# Patient Record
Sex: Female | Born: 1965 | Race: White | Hispanic: No | Marital: Married | Smoking: Never smoker
Health system: Southern US, Community
[De-identification: ages and names within clinical notes are randomized; demographics above are authoritative.]

## PROBLEM LIST (undated history)

## (undated) DIAGNOSIS — T4145XA Adverse effect of unspecified anesthetic, initial encounter: Secondary | ICD-10-CM

## (undated) DIAGNOSIS — R0600 Dyspnea, unspecified: Secondary | ICD-10-CM

## (undated) DIAGNOSIS — M1990 Unspecified osteoarthritis, unspecified site: Secondary | ICD-10-CM

## (undated) DIAGNOSIS — T8859XA Other complications of anesthesia, initial encounter: Secondary | ICD-10-CM

## (undated) DIAGNOSIS — Z8489 Family history of other specified conditions: Secondary | ICD-10-CM

## (undated) DIAGNOSIS — I10 Essential (primary) hypertension: Secondary | ICD-10-CM

## (undated) DIAGNOSIS — G4730 Sleep apnea, unspecified: Secondary | ICD-10-CM

## (undated) DIAGNOSIS — E119 Type 2 diabetes mellitus without complications: Secondary | ICD-10-CM

## (undated) DIAGNOSIS — E7800 Pure hypercholesterolemia, unspecified: Secondary | ICD-10-CM

## (undated) HISTORY — PX: WISDOM TOOTH EXTRACTION: SHX21

## (undated) HISTORY — PX: JOINT REPLACEMENT: SHX530

---

## 2016-08-23 DIAGNOSIS — Z872 Personal history of diseases of the skin and subcutaneous tissue: Secondary | ICD-10-CM | POA: Insufficient documentation

## 2018-02-06 DIAGNOSIS — M2020 Hallux rigidus, unspecified foot: Secondary | ICD-10-CM | POA: Insufficient documentation

## 2018-02-24 HISTORY — PX: TOE FUSION: SHX1070

## 2018-05-07 DIAGNOSIS — Z96652 Presence of left artificial knee joint: Secondary | ICD-10-CM | POA: Insufficient documentation

## 2018-06-27 ENCOUNTER — Encounter (INDEPENDENT_AMBULATORY_CARE_PROVIDER_SITE_OTHER): Payer: Self-pay | Admitting: *Deleted

## 2018-06-30 HISTORY — PX: DILATION AND CURETTAGE OF UTERUS: SHX78

## 2018-07-06 ENCOUNTER — Other Ambulatory Visit: Payer: Self-pay

## 2018-07-06 ENCOUNTER — Encounter
Admission: RE | Admit: 2018-07-06 | Discharge: 2018-07-06 | Disposition: A | Discharge status: Home / Self Care | Payer: BC Managed Care – PPO | Source: Ambulatory Visit | Attending: Orthopedic Surgery | Admitting: Orthopedic Surgery

## 2018-07-06 HISTORY — DX: Sleep apnea, unspecified: G47.30

## 2018-07-06 HISTORY — DX: Essential (primary) hypertension: I10

## 2018-07-06 HISTORY — DX: Type 2 diabetes mellitus without complications: E11.9

## 2018-07-06 HISTORY — DX: Adverse effect of unspecified anesthetic, initial encounter: T41.45XA

## 2018-07-06 HISTORY — DX: Other complications of anesthesia, initial encounter: T88.59XA

## 2018-07-06 HISTORY — DX: Pure hypercholesterolemia, unspecified: E78.00

## 2018-07-06 NOTE — Patient Instructions (Signed)
Your procedure is scheduled on: 07-19-18  Report to Same Day Surgery 2nd floor medical mall [facility]Medical Mall Entrance-take elevator on left to 2nd floor.  Check in with surgery information desk.) To find out your arrival time please call [phone] between 1PM - 3PM on 07-18-18  Remember: Instructions that are not followed completely may result in serious medical risk, up to and including death, or upon the discretion of your surgeon and anesthesiologist your surgery may need to be rescheduled.    _x___ 1. Do not eat food after midnight the night before your procedure. NO GUM OR CANDY AFTER MIDNIGHT.  You may drink WATER up to 2 hours before you are scheduled to arrive at the hospital for your procedure.  Do not drink WATER within 2 hours of your scheduled arrival to the hospital.  Type 1 and type 2 diabetics should only drink water.    __x__ 2. No Alcohol for 24 hours before or after surgery.   __x__3. No Smoking or e-cigarettes for 24 prior to surgery.  Do not use any chewable tobacco products for at least 6 hour prior to surgery   ____  4. Bring all medications with you on the day of surgery if instructed.    __x__ 5. Notify your doctor if there is any change in your medical condition     (cold, fever, infections).    x___6. On the morning of surgery brush your teeth with toothpaste and water.  You may rinse your mouth with mouth wash if you wish.  Do not swallow any toothpaste or mouthwash.   Do not wear jewelry, make-up, hairpins, clips or nail polish.  Do not wear lotions, powders, or perfumes. You may wear deodorant.  Do not shave 48 hours prior to surgery. Men may shave face and neck.  Do not bring valuables to the hospital.    [facility] is not responsible for any belongings or valuables.               Contacts, dentures or bridgework may not be worn into surgery.  Leave your suitcase in the car. After surgery it may be brought to your room.  For patients admitted to the  hospital, discharge time is determined by your treatment team.  _  Patients discharged the day of surgery will not be allowed to drive home.  You will need someone to drive you home and stay with you the night of your procedure.    Please read over the following fact sheets that you were given:   [facility] Pre[facility] for [facility]   _x___ TAKE THE FOLLOWING MEDICATION THE MORNING OF SURGERY WITH A SMALL SIP OF WATER. These include:  1. BUSPAR (PT STATES SHE IS TRYING TO WEAN HERSELF OFF THIS AND DOES NOT WANT TO TAKE AM OF SURGERY)  2. ZETIA  3.  4.  5.  6.  ____Fleets enema or Magnesium Citrate as directed.   ____ Use CHG Soap or sage wipes as directed on instruction sheet   ____ Use inhalers on the day of surgery and bring to hospital day of surgery  _X___ Stop Metformin 2 days prior to surgery-LAST DOSE ON Sunday, July 21ST    ____ Take 1/2 of usual insulin dose the night before surgery and none on the morning surgery.   _x___ Follow recommendations from Cardiologist, Pulmonologist or PCP regarding stopping Aspirin, Coumadin, Plavix ,Eliquis, Effient, or Pradaxa, and Pletal-STOP ASA 7 DAYS PRIOR TO SURGERY  X____Stop Anti-inflammatories such as Advil,  Aleve, Ibuprofen, Motrin, Naproxen, Naprosyn, Goodies powders or aspirin products 7 DAYS PRIOR TO SURGERY-OK to take Tylenol    _x___ Stop supplements until after surgery-STOP FISH OIL AND MELATONIN 7 DAYS PRIOR TO SURGERY   ____ Bring C-Pap to the hospital.

## 2018-07-12 ENCOUNTER — Other Ambulatory Visit: Payer: Self-pay

## 2018-07-18 MED ORDER — CEFAZOLIN SODIUM-DEXTROSE 2-4 GM/100ML-% IV SOLN
2.0000 g | INTRAVENOUS | Status: AC
  Administered 2018-07-19: 2 g via INTRAVENOUS

## 2018-07-19 ENCOUNTER — Encounter: Payer: Self-pay | Admitting: Orthopedic Surgery

## 2018-07-19 ENCOUNTER — Ambulatory Visit
Admission: RE | Admit: 2018-07-19 | Discharge: 2018-07-19 | Disposition: A | Discharge status: Home / Self Care | Payer: BC Managed Care – PPO | Source: Ambulatory Visit | Attending: Orthopedic Surgery | Admitting: Orthopedic Surgery

## 2018-07-19 ENCOUNTER — Other Ambulatory Visit: Payer: Self-pay

## 2018-07-19 ENCOUNTER — Encounter
Admission: RE | Disposition: A | Discharge status: Home / Self Care | Payer: Self-pay | Source: Ambulatory Visit | Attending: Orthopedic Surgery

## 2018-07-19 ENCOUNTER — Ambulatory Visit: Payer: BC Managed Care – PPO | Admitting: Anesthesiology

## 2018-07-19 DIAGNOSIS — E119 Type 2 diabetes mellitus without complications: Secondary | ICD-10-CM | POA: Insufficient documentation

## 2018-07-19 DIAGNOSIS — Z7984 Long term (current) use of oral hypoglycemic drugs: Secondary | ICD-10-CM | POA: Diagnosis not present

## 2018-07-19 DIAGNOSIS — Z7982 Long term (current) use of aspirin: Secondary | ICD-10-CM | POA: Insufficient documentation

## 2018-07-19 DIAGNOSIS — I10 Essential (primary) hypertension: Secondary | ICD-10-CM | POA: Insufficient documentation

## 2018-07-19 DIAGNOSIS — Z79899 Other long term (current) drug therapy: Secondary | ICD-10-CM | POA: Insufficient documentation

## 2018-07-19 DIAGNOSIS — E7800 Pure hypercholesterolemia, unspecified: Secondary | ICD-10-CM | POA: Insufficient documentation

## 2018-07-19 DIAGNOSIS — M24662 Ankylosis, left knee: Secondary | ICD-10-CM | POA: Insufficient documentation

## 2018-07-19 DIAGNOSIS — K219 Gastro-esophageal reflux disease without esophagitis: Secondary | ICD-10-CM | POA: Insufficient documentation

## 2018-07-19 DIAGNOSIS — G4730 Sleep apnea, unspecified: Secondary | ICD-10-CM | POA: Insufficient documentation

## 2018-07-19 DIAGNOSIS — Z96652 Presence of left artificial knee joint: Secondary | ICD-10-CM

## 2018-07-19 HISTORY — PX: KNEE ARTHROSCOPY: SHX127

## 2018-07-19 LAB — C-REACTIVE PROTEIN

## 2018-07-19 LAB — GLUCOSE, CAPILLARY
Glucose-Capillary: 90 mg/dL (ref 70–99)
Glucose-Capillary: 95 mg/dL (ref 70–99)

## 2018-07-19 LAB — SEDIMENTATION RATE: Sed Rate: 24 mm/hr (ref 0–30)

## 2018-07-19 SURGERY — ARTHROSCOPY, KNEE
Anesthesia: General | Laterality: Left

## 2018-07-19 MED ORDER — OXYCODONE HCL 5 MG PO TABS: 5.0000 mg | ORAL_TABLET | Freq: Once | ORAL | Status: DC | PRN

## 2018-07-19 MED ORDER — SODIUM CHLORIDE 0.9 % IV SOLN: INTRAVENOUS | Status: DC

## 2018-07-19 MED ORDER — FENTANYL CITRATE (PF) 100 MCG/2ML IJ SOLN
25.0000 ug | INTRAMUSCULAR | Status: DC | PRN
  Administered 2018-07-19 (×2): 25 ug via INTRAVENOUS
  Administered 2018-07-19: 50 ug via INTRAVENOUS

## 2018-07-19 MED ORDER — MIDAZOLAM HCL 2 MG/2ML IJ SOLN
INTRAMUSCULAR | Status: AC
  Filled 2018-07-19: qty 2

## 2018-07-19 MED ORDER — ONDANSETRON HCL 4 MG/2ML IJ SOLN
INTRAMUSCULAR | Status: DC | PRN
  Administered 2018-07-19: 4 mg via INTRAVENOUS

## 2018-07-19 MED ORDER — CEFAZOLIN SODIUM-DEXTROSE 2-4 GM/100ML-% IV SOLN
INTRAVENOUS | Status: AC
  Filled 2018-07-19: qty 100

## 2018-07-19 MED ORDER — MIDAZOLAM HCL 2 MG/2ML IJ SOLN
INTRAMUSCULAR | Status: DC | PRN
  Administered 2018-07-19: 2 mg via INTRAVENOUS

## 2018-07-19 MED ORDER — MORPHINE SULFATE (PF) 4 MG/ML IV SOLN
INTRAVENOUS | Status: AC
  Filled 2018-07-19: qty 1

## 2018-07-19 MED ORDER — PROPOFOL 10 MG/ML IV BOLUS
INTRAVENOUS | Status: DC | PRN
  Administered 2018-07-19: 200 mg via INTRAVENOUS

## 2018-07-19 MED ORDER — HYDROCODONE-ACETAMINOPHEN 5-325 MG PO TABS
ORAL_TABLET | ORAL | Status: AC
  Filled 2018-07-19: qty 1

## 2018-07-19 MED ORDER — LACTATED RINGERS IV SOLN
INTRAVENOUS | Status: DC | PRN
  Administered 2018-07-19: 15:00:00 via INTRAVENOUS

## 2018-07-19 MED ORDER — ACETAMINOPHEN 10 MG/ML IV SOLN
INTRAVENOUS | Status: AC
  Filled 2018-07-19: qty 100

## 2018-07-19 MED ORDER — FENTANYL CITRATE (PF) 100 MCG/2ML IJ SOLN
INTRAMUSCULAR | Status: DC | PRN
  Administered 2018-07-19: 100 ug via INTRAVENOUS
  Administered 2018-07-19 (×4): 50 ug via INTRAVENOUS

## 2018-07-19 MED ORDER — MORPHINE SULFATE 4 MG/ML IJ SOLN
INTRAMUSCULAR | Status: DC | PRN
  Administered 2018-07-19: 4 mg via INTRAVENOUS

## 2018-07-19 MED ORDER — ONDANSETRON HCL 4 MG/2ML IJ SOLN: 4.0000 mg | Freq: Four times a day (QID) | INTRAMUSCULAR | Status: DC | PRN

## 2018-07-19 MED ORDER — HYDROCODONE-ACETAMINOPHEN 5-325 MG PO TABS: 1.0000 | ORAL_TABLET | ORAL | 0 refills | Status: DC | PRN

## 2018-07-19 MED ORDER — FENTANYL CITRATE (PF) 100 MCG/2ML IJ SOLN
INTRAMUSCULAR | Status: AC
  Filled 2018-07-19: qty 2

## 2018-07-19 MED ORDER — CELECOXIB 200 MG PO CAPS
ORAL_CAPSULE | ORAL | Status: AC
  Filled 2018-07-19: qty 2

## 2018-07-19 MED ORDER — METOCLOPRAMIDE HCL 10 MG PO TABS: 5.0000 mg | ORAL_TABLET | Freq: Three times a day (TID) | ORAL | Status: DC | PRN

## 2018-07-19 MED ORDER — METOCLOPRAMIDE HCL 5 MG/ML IJ SOLN: 5.0000 mg | Freq: Three times a day (TID) | INTRAMUSCULAR | Status: DC | PRN

## 2018-07-19 MED ORDER — FAMOTIDINE 20 MG PO TABS
20.0000 mg | ORAL_TABLET | Freq: Once | ORAL | Status: AC
  Administered 2018-07-19: 20 mg via ORAL

## 2018-07-19 MED ORDER — DEXAMETHASONE SODIUM PHOSPHATE 10 MG/ML IJ SOLN
INTRAMUSCULAR | Status: DC | PRN
  Administered 2018-07-19: 10 mg via INTRAVENOUS

## 2018-07-19 MED ORDER — BUPIVACAINE-EPINEPHRINE 0.25% -1:200000 IJ SOLN
INTRAMUSCULAR | Status: DC | PRN
  Administered 2018-07-19: 30 mL

## 2018-07-19 MED ORDER — HYDROCODONE-ACETAMINOPHEN 5-325 MG PO TABS
1.0000 | ORAL_TABLET | ORAL | Status: DC | PRN
  Administered 2018-07-19: 1 via ORAL

## 2018-07-19 MED ORDER — FENTANYL CITRATE (PF) 100 MCG/2ML IJ SOLN
INTRAMUSCULAR | Status: AC
  Administered 2018-07-19: 25 ug via INTRAVENOUS
  Filled 2018-07-19: qty 2

## 2018-07-19 MED ORDER — SODIUM CHLORIDE 0.9 % IV SOLN
INTRAVENOUS | Status: DC
  Administered 2018-07-19: 14:00:00 via INTRAVENOUS

## 2018-07-19 MED ORDER — ACETAMINOPHEN 10 MG/ML IV SOLN
INTRAVENOUS | Status: DC | PRN
  Administered 2018-07-19: 1000 mg via INTRAVENOUS

## 2018-07-19 MED ORDER — LIDOCAINE HCL (CARDIAC) PF 100 MG/5ML IV SOSY
PREFILLED_SYRINGE | INTRAVENOUS | Status: DC | PRN
  Administered 2018-07-19: 100 mg via INTRAVENOUS

## 2018-07-19 MED ORDER — CHLORHEXIDINE GLUCONATE 4 % EX LIQD: 60.0000 mL | Freq: Once | CUTANEOUS | Status: DC

## 2018-07-19 MED ORDER — FAMOTIDINE 20 MG PO TABS
ORAL_TABLET | ORAL | Status: AC
  Filled 2018-07-19: qty 1

## 2018-07-19 MED ORDER — PROPOFOL 10 MG/ML IV BOLUS
INTRAVENOUS | Status: AC
  Filled 2018-07-19: qty 20

## 2018-07-19 MED ORDER — ONDANSETRON HCL 4 MG PO TABS: 4.0000 mg | ORAL_TABLET | Freq: Four times a day (QID) | ORAL | Status: DC | PRN

## 2018-07-19 MED ORDER — CELECOXIB 200 MG PO CAPS
400.0000 mg | ORAL_CAPSULE | Freq: Once | ORAL | Status: AC
  Administered 2018-07-19: 400 mg via ORAL

## 2018-07-19 MED ORDER — BUPIVACAINE-EPINEPHRINE (PF) 0.25% -1:200000 IJ SOLN
INTRAMUSCULAR | Status: AC
  Filled 2018-07-19: qty 30

## 2018-07-19 MED ORDER — OXYCODONE HCL 5 MG/5ML PO SOLN: 5.0000 mg | Freq: Once | ORAL | Status: DC | PRN

## 2018-07-19 MED ORDER — SEVOFLURANE IN SOLN
RESPIRATORY_TRACT | Status: AC
  Filled 2018-07-19: qty 250

## 2018-07-19 MED ORDER — PHENYLEPHRINE HCL 10 MG/ML IJ SOLN
INTRAMUSCULAR | Status: DC | PRN
  Administered 2018-07-19 (×3): 100 ug via INTRAVENOUS

## 2018-07-19 SURGICAL SUPPLY — 28 items
ABLATOR ASPIRATE 50D MULTI-PRT (SURGICAL WAND) ×1 IMPLANT
BLADE EXCALIBUR 4.0X13 (MISCELLANEOUS) ×1 IMPLANT
BLADE SHAVER 4.5 DBL SERAT CV (CUTTER) IMPLANT
CUFF TOURN 24 STER (MISCELLANEOUS) IMPLANT
CUFF TOURN 30 STER DUAL PORT (MISCELLANEOUS) ×1 IMPLANT
DRSG DERMACEA 8X12 NADH (GAUZE/BANDAGES/DRESSINGS) ×2 IMPLANT
DURAPREP 26ML APPLICATOR (WOUND CARE) ×4 IMPLANT
DW OUTFLOW CASSETTE/TUBE SET (MISCELLANEOUS) ×1 IMPLANT
GAUZE SPONGE 4X4 12PLY STRL (GAUZE/BANDAGES/DRESSINGS) ×2 IMPLANT
GLOVE BIOGEL M STRL SZ7.5 (GLOVE) ×2 IMPLANT
GLOVE INDICATOR 8.0 STRL GRN (GLOVE) ×2 IMPLANT
GOWN STRL REUS W/ TWL LRG LVL3 (GOWN DISPOSABLE) ×2 IMPLANT
GOWN STRL REUS W/TWL LRG LVL3 (GOWN DISPOSABLE) ×2
IV LACTATED RINGER IRRG 3000ML (IV SOLUTION) ×10
IV LR IRRIG 3000ML ARTHROMATIC (IV SOLUTION) ×6 IMPLANT
KIT TURNOVER KIT A (KITS) ×2 IMPLANT
MANIFOLD NEPTUNE II (INSTRUMENTS) ×2 IMPLANT
PACK ARTHROSCOPY KNEE (MISCELLANEOUS) ×2 IMPLANT
PROBE APOLLO 90XL (SURGICAL WAND) ×1 IMPLANT
SET TUBE SUCT SHAVER OUTFL 24K (TUBING) ×1 IMPLANT
SET TUBE TIP INTRA-ARTICULAR (MISCELLANEOUS) ×2 IMPLANT
SUT ETHILON 3-0 FS-10 30 BLK (SUTURE) ×2
SUTURE EHLN 3-0 FS-10 30 BLK (SUTURE) ×1 IMPLANT
TUBING ARTHRO INFLOW-ONLY STRL (TUBING) ×1 IMPLANT
TUBING REDEUCE PAT W/CON 8IN (MISCELLANEOUS) ×1 IMPLANT
TUBING REDEUCE PUMP W/CON 8IN (MISCELLANEOUS) ×1 IMPLANT
WAND HAND CNTRL MULTIVAC 50 (MISCELLANEOUS) ×1 IMPLANT
WRAP KNEE W/COLD PACKS 25.5X14 (SOFTGOODS) ×2 IMPLANT

## 2018-07-19 NOTE — Anesthesia Procedure Notes (Signed)
Procedure Name: LMA Insertion Date/Time: 07/19/2018 3:21 PM Performed by: Junious SilkNoles, Vedha Tercero, CRNA Pre-anesthesia Checklist: Patient identified, Patient being monitored, Timeout performed, Emergency Drugs available and Suction available Patient Re-evaluated:Patient Re-evaluated prior to induction Oxygen Delivery Method: Circle system utilized Preoxygenation: Pre-oxygenation with 100% oxygen Induction Type: IV induction Ventilation: Mask ventilation without difficulty LMA: LMA inserted LMA Size: 3.5 Tube type: Oral Number of attempts: 1 Placement Confirmation: positive ETCO2 and breath sounds checked- equal and bilateral Tube secured with: Tape Dental Injury: Teeth and Oropharynx as per pre-operative assessment

## 2018-07-19 NOTE — Anesthesia Post-op Follow-up Note (Signed)
Anesthesia QCDR form completed.        

## 2018-07-19 NOTE — Anesthesia Preprocedure Evaluation (Signed)
Anesthesia Evaluation  Patient identified by MRN, date of birth, ID band Patient awake    Reviewed: Allergy & Precautions, H&P , NPO status , Patient's Chart, lab work & pertinent test results  History of Anesthesia Complications Negative for: history of anesthetic complications  Airway Mallampati: III  TM Distance: <3 FB Neck ROM: full    Dental  (+) Chipped   Pulmonary neg shortness of breath, sleep apnea and Continuous Positive Airway Pressure Ventilation ,           Cardiovascular Exercise Tolerance: Good hypertension, (-) angina(-) Past MI and (-) DOE      Neuro/Psych negative neurological ROS  negative psych ROS   GI/Hepatic negative GI ROS, Neg liver ROS, neg GERD  ,  Endo/Other  diabetes, Type 2, Oral Hypoglycemic Agents  Renal/GU      Musculoskeletal   Abdominal   Peds  Hematology negative hematology ROS (+)   Anesthesia Other Findings Past Medical History: No date: Complication of anesthesia     Comment:  TOOK ALOT MORE ANESTHESIA FOR TOE SURGERY TO GET HER               SEDATED No date: Diabetes mellitus without complication (HCC) No date: Hypercholesterolemia No date: Hypertension No date: Sleep apnea  Past Surgical History: 06/30/2018: DILATION AND CURETTAGE OF UTERUS No date: JOINT REPLACEMENT; Left     Comment:  KNEE 02/2018: TOE FUSION; Left  BMI    Body Mass Index:  29.79 kg/m      Reproductive/Obstetrics negative OB ROS                             Anesthesia Physical Anesthesia Plan  ASA: III  Anesthesia Plan: General   Post-op Pain Management:    Induction: Intravenous  PONV Risk Score and Plan: Ondansetron, Dexamethasone, Midazolam and Treatment may vary due to age or medical condition  Airway Management Planned: LMA  Additional Equipment:   Intra-op Plan:   Post-operative Plan: Extubation in OR  Informed Consent: I have reviewed the  patients History and Physical, chart, labs and discussed the procedure including the risks, benefits and alternatives for the proposed anesthesia with the patient or authorized representative who has indicated his/her understanding and acceptance.   Dental Advisory Given  Plan Discussed with: Anesthesiologist, CRNA and Surgeon  Anesthesia Plan Comments: (Patient consented for risks of anesthesia including but not limited to:  - adverse reactions to medications - damage to teeth, lips or other oral mucosa - sore throat or hoarseness - Damage to heart, brain, lungs or loss of life  Patient voiced understanding.)        Anesthesia Quick Evaluation

## 2018-07-19 NOTE — Transfer of Care (Signed)
Immediate Anesthesia Transfer of Care Note  Patient: Brooke LeaverKelly S Lisowski  Procedure(s) Performed: ARTHROSCOPY KNEE WITH EXTENSIVE SYNOVECTOMY AND LYSIS OF ADHESIONS (Left )  Patient Location: PACU  Anesthesia Type:General  Level of Consciousness: sedated  Airway & Oxygen Therapy: Patient Spontanous Breathing and Patient connected to face mask oxygen  Post-op Assessment: Report given to RN and Post -op Vital signs reviewed and stable  Post vital signs: Reviewed and stable  Last Vitals:  Vitals Value Taken Time  BP 141/94 07/19/2018  5:46 PM  Temp    Pulse 103 07/19/2018  5:47 PM  Resp 22 07/19/2018  5:47 PM  SpO2 100 % 07/19/2018  5:47 PM  Vitals shown include unvalidated device data.  Last Pain:  Vitals:   07/19/18 1350  TempSrc: Temporal  PainSc: 0-No pain         Complications: No apparent anesthesia complications

## 2018-07-19 NOTE — Anesthesia Postprocedure Evaluation (Signed)
Anesthesia Post Note  Patient: Brooke LeaverKelly S Spinnato  Procedure(s) Performed: ARTHROSCOPY KNEE WITH EXTENSIVE SYNOVECTOMY AND LYSIS OF ADHESIONS (Left )  Patient location during evaluation: PACU Anesthesia Type: General Level of consciousness: awake and alert and oriented Pain management: pain level controlled Vital Signs Assessment: post-procedure vital signs reviewed and stable Respiratory status: spontaneous breathing, nonlabored ventilation and respiratory function stable Cardiovascular status: blood pressure returned to baseline and stable Postop Assessment: no signs of nausea or vomiting Anesthetic complications: no     Last Vitals:  Vitals:   07/19/18 1831 07/19/18 1843  BP: 135/78 134/76  Pulse: 89 85  Resp: 16 16  Temp: 36.6 C   SpO2: 99% 98%    Last Pain:  Vitals:   07/19/18 1831  TempSrc:   PainSc: 3                  Alajiah Dutkiewicz

## 2018-07-19 NOTE — Discharge Instructions (Signed)
°  Instructions after Knee Arthroscopy  ° ° Luvern Mischke P. Mahitha Hickling, Jr., M.D.    ° Dept. of Orthopaedics & Sports Medicine ° [facility] ° [address] ° [place], [place]  [address] ° ° Phone: [phone]   Fax: [phone] ° ° °DIET: °• Drink plenty of non-alcoholic fluids & begin a light diet. °• Resume your normal diet the day after surgery. ° °ACTIVITY:  °• You may use crutches or a walker with weight-bearing as tolerated, unless instructed otherwise. °• You may wean yourself off of the walker or crutches as tolerated.  °• Begin doing gentle exercises. Exercising will reduce the pain and swelling, increase motion, and prevent muscle weakness.   °• Avoid strenuous activities or athletics for a minimum of 4-6 weeks after arthroscopic surgery. °• Do not drive or operate any equipment until instructed. ° °WOUND CARE:  °• Place one to two pillows under the knee the first day or two when sitting or lying.  °• Continue to use the ice packs periodically to reduce pain and swelling. °• The small incisions in your knee are closed with nylon stitches. The stitches will be removed in the office. °• The bulky dressing may be removed on the second day after surgery. DO NOT TOUCH THE STITCHES. Put a Band-Aid over each stitch. Do NOT use any ointments or creams on the incisions.  °• You may bathe or shower after the stitches are removed at the first office visit following surgery. ° °MEDICATIONS: °• You may resume your regular medications. °• Please take the pain medication as prescribed. °• Do not take pain medication on an empty stomach. °• Do not drive or drink alcoholic beverages when taking pain medications. ° °CALL THE OFFICE FOR: °• Temperature above 101 degrees °• Excessive bleeding or drainage on the dressing. °• Excessive swelling, coldness, or paleness of the toes. °• Persistent nausea and vomiting. ° °FOLLOW-UP:  °• You should have an appointment to return to the office in 7-10 days after surgery.  °  °

## 2018-07-19 NOTE — Op Note (Signed)
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   ASSISTANT: None  ANESTHESIA: general  ESTIMATED BLOOD LOSS: Minimal  FLUIDS REPLACED: 800 mL of crystalloid  TOURNIQUET TIME: 48 minutes  DRAINS: None  INDICATIONS FOR SURGERY: Brooke Martin is a 52 y.o. year old female who underwent left total knee arthroplasty approximately 2 years ago by another Careers adviser.  Despite aggressive physical therapy she had extremely limited knee range of motion that was significantly interfering with her function.. After discussion of the risks and benefits of surgical intervention, the patient expressed understanding of the risks benefits and agree with plans for ambulation of the left knee under anesthesia, left knee arthroscopy and lysis of adhesions.Marland Kitchen   PROCEDURE IN DETAIL: The patient was brought into the operating room and, after adequate general anesthesia was achieved, tourniquet was placed on patient's upper left thigh.  A "timeout" was performed as per usual protocol.  The knee was flexed prior to any manipulation with knee flexion noted to be 84 degrees.  I next placed my left shoulder against the pretibial area with my ear against the lateral aspect of the knee and gently manipulated the knee.  There was audible lysis of adhesions with marked improvement of her knee range of motion.  Postmanipulation range of motion demonstrated flexion of 124 degrees.  Next, the leg was placed in a holder and all bony prominences well-padded.  The patient's left knee leg were cleaned and prepped with alcohol and DuraPrep and draped usual sterile fashion.  A second timeout was  performed.  The anticipated portal sites were injected with 0.25% Marcaine with epinephrine.  Anterolateral portal was created and a cannula was inserted.  The knee was distended with fluid.  Under direct visualization, an anteromedial portal site was created and a hooked probe was inserted.  Inspection of the knee demonstrated significant fibrotic tissue anterior to the implant as well as along the medial and lateral gutters.  A 4.5 mm incisor shaver was inserted and the dense fibrotic bands were debrided getting first with the medial compartment.  She will debridement was performed using both a 50 degree and subsequently a 90 degree wand.  The lysis of adhesions was continued around the parapatellar region.  A superior lateral portal site was created in an attempt to improve outflow and clear the bloody fluid from the knee.  Tourniquet was subsequently inflated to 300 mmHg.  Debris was continued with lysis of adhesions in the suprapatellar pouch.  The scope was removed from the antero-lateral portal reinserted via the anteromedial portal for continued debridement and lysis of adhesions along the lateral gutter.  At the conclusion of the debridement, the patella was demonstrated to be free of any adhesions and tracked well within the intercondylar notch.  The implants were noted to be in good condition.  The outflow cannula was removed and the knee was suctioned free of any residual fluid.  The superior lateral and anterolateral portals were reapproximated using #2-0 nylon.  A combination of 0.25% Marcaine with epinephrine and 4 mg of morphine was injected via the scope.  Scope was then removed and the anteromedial portal was reapproximated using #2-0 nylon.  Tourniquet was deflated after total tourniquet time of 48 minutes.  The patient tolerated procedure well.  She was transported to the recovery room in stable condition.   Brooke Martin, Jr. M.D.

## 2018-07-19 NOTE — H&P (Signed)
The patient has been re-examined, and the chart reviewed, and there have been no interval changes to the documented history and physical.    The risks, benefits, and alternatives have been discussed at length. The patient expressed understanding of the risks benefits and agreed with plans for surgical intervention.  James P. Hooten, Jr. M.D.    

## 2018-07-20 ENCOUNTER — Encounter: Payer: Self-pay | Admitting: Orthopedic Surgery

## 2018-11-22 DIAGNOSIS — R200 Anesthesia of skin: Secondary | ICD-10-CM | POA: Insufficient documentation

## 2018-12-27 HISTORY — PX: ABDOMINAL HYSTERECTOMY: SHX81

## 2019-02-22 DIAGNOSIS — S93529A Sprain of metatarsophalangeal joint of unspecified toe(s), initial encounter: Secondary | ICD-10-CM | POA: Insufficient documentation

## 2019-05-08 ENCOUNTER — Encounter (INDEPENDENT_AMBULATORY_CARE_PROVIDER_SITE_OTHER): Payer: Self-pay | Admitting: *Deleted

## 2019-05-30 ENCOUNTER — Encounter (INDEPENDENT_AMBULATORY_CARE_PROVIDER_SITE_OTHER): Payer: Self-pay

## 2019-12-28 HISTORY — PX: COLONOSCOPY: SHX174

## 2020-02-22 ENCOUNTER — Other Ambulatory Visit: Payer: Self-pay | Admitting: Student

## 2020-02-22 DIAGNOSIS — M5412 Radiculopathy, cervical region: Secondary | ICD-10-CM

## 2020-03-03 ENCOUNTER — Other Ambulatory Visit: Payer: Self-pay

## 2020-03-03 ENCOUNTER — Ambulatory Visit
Admission: RE | Admit: 2020-03-03 | Discharge: 2020-03-03 | Disposition: A | Discharge status: Home / Self Care | Payer: BC Managed Care – PPO | Source: Ambulatory Visit | Attending: Student | Admitting: Student

## 2020-03-03 DIAGNOSIS — M5412 Radiculopathy, cervical region: Secondary | ICD-10-CM | POA: Diagnosis not present

## 2020-07-27 HISTORY — PX: OTHER SURGICAL HISTORY: SHX169

## 2020-07-27 HISTORY — PX: BACK SURGERY: SHX140

## 2020-10-07 DIAGNOSIS — M24662 Ankylosis, left knee: Secondary | ICD-10-CM | POA: Insufficient documentation

## 2021-01-21 ENCOUNTER — Other Ambulatory Visit: Payer: Self-pay | Admitting: Orthopedic Surgery

## 2021-01-21 ENCOUNTER — Other Ambulatory Visit (HOSPITAL_COMMUNITY): Payer: Self-pay | Admitting: Orthopedic Surgery

## 2021-01-21 DIAGNOSIS — M24662 Ankylosis, left knee: Secondary | ICD-10-CM

## 2021-02-02 ENCOUNTER — Encounter
Admission: RE | Admit: 2021-02-02 | Discharge: 2021-02-02 | Disposition: A | Discharge status: Home / Self Care | Payer: BC Managed Care – PPO | Source: Ambulatory Visit | Attending: Orthopedic Surgery | Admitting: Orthopedic Surgery

## 2021-02-02 ENCOUNTER — Ambulatory Visit
Admission: RE | Admit: 2021-02-02 | Discharge: 2021-02-02 | Disposition: A | Discharge status: Home / Self Care | Payer: BC Managed Care – PPO | Source: Ambulatory Visit | Attending: Orthopedic Surgery | Admitting: Orthopedic Surgery

## 2021-02-02 ENCOUNTER — Other Ambulatory Visit: Payer: Self-pay

## 2021-02-02 DIAGNOSIS — M24662 Ankylosis, left knee: Secondary | ICD-10-CM | POA: Insufficient documentation

## 2021-02-02 MED ORDER — TECHNETIUM TC 99M MEDRONATE IV KIT: 22.3600 | PACK | Freq: Once | INTRAVENOUS | Status: DC | PRN

## 2021-02-05 ENCOUNTER — Ambulatory Visit: Payer: BC Managed Care – PPO | Admitting: Neurology

## 2021-02-08 DIAGNOSIS — T84033A Mechanical loosening of internal left knee prosthetic joint, initial encounter: Secondary | ICD-10-CM | POA: Insufficient documentation

## 2021-03-08 NOTE — Discharge Instructions (Signed)
Instructions after Total Knee Replacement   Brooke Martin, Jr., M.D.     Dept. of Orthopaedics & Sports Medicine  [facility]  [address]  [place], [place]  [address]  Phone: [phone]   Fax: [phone]    DIET: Drink plenty of non-alcoholic fluids. Resume your normal diet. Include foods high in fiber.  ACTIVITY:  You may use crutches or a walker with weight-bearing as tolerated, unless instructed otherwise. You may be weaned off of the walker or crutches by your Physical Therapist.  Do NOT place pillows under the knee. Anything placed under the knee could limit your ability to straighten the knee.   Continue doing gentle exercises. Exercising will reduce the pain and swelling, increase motion, and prevent muscle weakness.   Please continue to use the TED compression stockings for 6 weeks. You may remove the stockings at night, but should reapply them in the morning. Do not drive or operate any equipment until instructed.  WOUND CARE:  Continue to use the PolarCare or ice packs periodically to reduce pain and swelling. You may bathe or shower after the staples are removed at the first office visit following surgery.  MEDICATIONS: You may resume your regular medications. Please take the pain medication as prescribed on the medication. Do not take pain medication on an empty stomach. You have been given a prescription for a blood thinner (Lovenox or Coumadin). Please take the medication as instructed. (NOTE: After completing a 2 week course of Lovenox, take one Enteric-coated aspirin once a day. This along with elevation will help reduce the possibility of phlebitis in your operated leg.) Do not drive or drink alcoholic beverages when taking pain medications.  CALL THE OFFICE FOR: Temperature above 101 degrees Excessive bleeding or drainage on the dressing. Excessive swelling, coldness, or paleness of the toes. Persistent nausea and vomiting.  FOLLOW-UP:  You  should have an appointment to return to the office in 10-14 days after surgery. Arrangements have been made for continuation of Physical Therapy (either home therapy or outpatient therapy).   [facility] Department Directory         [email or web address]       [email or web address]          Cardiology  Appointments: [place] - [phone] [place] - [phone]  Endocrinology  Appointments: [place] - [phone] [place] - [phone]  Gastroenterology  Appointments: [place] - [phone] [place] - [phone]        General Surgery   Appointments: [place] - [phone]  Internal Medicine/Family Medicine  Appointments: [place] - [phone] [place] - [phone] [place] - [phone]  Metabolic and Weigh Loss Surgery  Appointments: [place] - [phone]        Neurology  Appointments: [place] - [phone] [place] - [phone]  Neurosurgery  Appointments: [place] - [phone]  Obstetrics & Gynecology  Appointments: [place] - [phone] [place] - [phone]        Pediatrics  Appointments: [place] - [phone] [place] - [phone]  Physiatry  Appointments: [place] -[phone]  Physical Therapy  Appointments: [place] - [phone] [place] - [phone]        Podiatry  Appointments: [place] - [phone] [place] - [phone]  Pulmonology  Appointments: [place] - [phone]  Rheumatology  Appointments: [place] - [phone]        [place] Location: [facility]  [address] [place], [place]  [address]  [place] Location: [facility] [address] [place], [place]  [address]  [place] Location: [facility] [address] [place], [place]  [address]    

## 2021-04-21 ENCOUNTER — Encounter
Admission: RE | Admit: 2021-04-21 | Discharge: 2021-04-21 | Disposition: A | Discharge status: Home / Self Care | Payer: BC Managed Care – PPO | Source: Ambulatory Visit | Attending: Orthopedic Surgery | Admitting: Orthopedic Surgery

## 2021-04-21 ENCOUNTER — Other Ambulatory Visit: Payer: Self-pay

## 2021-04-21 DIAGNOSIS — Z01818 Encounter for other preprocedural examination: Secondary | ICD-10-CM | POA: Diagnosis not present

## 2021-04-21 HISTORY — DX: Adverse effect of unspecified anesthetic, initial encounter: T41.45XA

## 2021-04-21 HISTORY — DX: Unspecified osteoarthritis, unspecified site: M19.90

## 2021-04-21 HISTORY — DX: Dyspnea, unspecified: R06.00

## 2021-04-21 HISTORY — DX: Family history of other specified conditions: Z84.89

## 2021-04-21 LAB — COMPREHENSIVE METABOLIC PANEL
ALT: 56 U/L — ABNORMAL HIGH (ref 0–44)
AST: 57 U/L — ABNORMAL HIGH (ref 15–41)
Albumin: 3.4 g/dL — ABNORMAL LOW (ref 3.5–5.0)
Alkaline Phosphatase: 61 U/L (ref 38–126)
Anion gap: 9 (ref 5–15)
BUN: 21 mg/dL — ABNORMAL HIGH (ref 6–20)
CO2: 23 mmol/L (ref 22–32)
Calcium: 8.9 mg/dL (ref 8.9–10.3)
Chloride: 104 mmol/L (ref 98–111)
Creatinine, Ser: 0.66 mg/dL (ref 0.44–1.00)
GFR, Estimated: >60 mL/min (ref >60)
Glucose, Bld: 141 mg/dL — ABNORMAL HIGH (ref 70–99)
Potassium: 3.7 mmol/L (ref 3.5–5.1)
Sodium: 136 mmol/L (ref 135–145)
Total Bilirubin: 0.7 mg/dL (ref 0.3–1.2)
Total Protein: 6.7 g/dL (ref 6.5–8.1)

## 2021-04-21 LAB — URINALYSIS, ROUTINE W REFLEX MICROSCOPIC
Bilirubin Urine: NEGATIVE
Glucose, UA: NEGATIVE mg/dL
Hgb urine dipstick: NEGATIVE
Ketones, ur: NEGATIVE mg/dL
Leukocytes,Ua: NEGATIVE
Nitrite: NEGATIVE
Protein, ur: NEGATIVE mg/dL
Specific Gravity, Urine: 1.011 (ref 1.005–1.030)
pH: 7 (ref 5.0–8.0)

## 2021-04-21 LAB — TYPE AND SCREEN
ABO/RH(D): B POS
Antibody Screen: NEGATIVE

## 2021-04-21 LAB — C-REACTIVE PROTEIN: CRP: 0.7 mg/dL (ref <1.0)

## 2021-04-21 LAB — CBC
HCT: 38.1 % (ref 36.0–46.0)
Hemoglobin: 13.2 g/dL (ref 12.0–15.0)
MCH: 30 pg (ref 26.0–34.0)
MCHC: 34.6 g/dL (ref 30.0–36.0)
MCV: 86.6 fL (ref 80.0–100.0)
Platelets: 206 10*3/uL (ref 150–400)
RBC: 4.4 MIL/uL (ref 3.87–5.11)
RDW: 13.1 % (ref 11.5–15.5)
WBC: 6.7 10*3/uL (ref 4.0–10.5)
nRBC: 0 % (ref 0.0–0.2)

## 2021-04-21 LAB — PROTIME-INR
INR: 1 (ref 0.8–1.2)
Prothrombin Time: 13 seconds (ref 11.4–15.2)

## 2021-04-21 LAB — HEMOGLOBIN A1C
Hgb A1c MFr Bld: 5.6 % (ref 4.8–5.6)
Mean Plasma Glucose: 114.02 mg/dL

## 2021-04-21 LAB — SURGICAL PCR SCREEN
MRSA, PCR: NEGATIVE
Staphylococcus aureus: NEGATIVE

## 2021-04-21 LAB — APTT: aPTT: 31 seconds (ref 24–36)

## 2021-04-21 LAB — SEDIMENTATION RATE: Sed Rate: 23 mm/hr (ref 0–30)

## 2021-04-21 NOTE — Patient Instructions (Addendum)
Your procedure is scheduled on: 05/04/21 Report to the Registration Desk on the 1st floor of the [facility]. To find out your arrival time, please call [phone] between 1PM - 3PM on: 05/01/21 Covid Test 05/01/21 at [facility] - 8 am to 2 pm.  REMEMBER: Instructions that are not followed completely may result in serious medical risk, up to and including death; or upon the discretion of your surgeon and anesthesiologist your surgery may need to be rescheduled.  Do not eat food after midnight the night before surgery.  No gum chewing, lozengers or hard candies.  You may however, drink CLEAR liquids up to 2 hours before you are scheduled to arrive for your surgery. Do not drink anything within 2 hours of your scheduled arrival time.  Clear liquids include: - water  - apple juice without pulp - gatorade (not RED, PURPLE, OR BLUE) - black coffee or tea (Do NOT add milk or creamers to the coffee or tea) Do NOT drink anything that is not on this list.  Type 1 and Type 2 diabetics should only drink water.  In addition, your doctor has ordered for you to drink the provided  Gatorade G2 Drinking this carbohydrate drink up to two hours before surgery helps to reduce insulin resistance and improve patient outcomes. Please complete drinking 2 hours prior to scheduled arrival time.  TAKE THESE MEDICATIONS THE MORNING OF SURGERY WITH A SIP OF WATER:  - Bempedoic Acid-Ezetimibe (NEXLIZET) 180-10 MG TABS - carbamazepine (CARBATROL) 100 MG 12 hr capsule - metoprolol succinate (TOPROL-XL) 50 MG 24 hr tablet - omeprazole (PRILOSEC) 20 MG capsule, take one the night before and one on the morning of surgery - helps to prevent nausea after surgery. - oxybutynin (DITROPAN) 5 MG tablet  Stop Metformin 2 days prior to surgery. Do not take 05/07, 05/08, or the morning of surgery.  Follow recommendations from Cardiologist, Pulmonologist or PCP regarding stopping Aspirin, Coumadin, Plavix, Eliquis,  Pradaxa, or Pletal. Stop Aspirin 81mg  1 week before surgery beginning 04/26/21 and do not take the morning of surgery.  One week prior to surgery: Stop Anti-inflammatories (NSAIDS) such as Advil, Aleve, Ibuprofen, Motrin, Naproxen, Naprosyn and Aspirin based products such as Excedrin, Goodys Powder, BC Powder.  Stop beginning 04/26/21- ANY OVER THE COUNTER supplements until after surgery.Coenzyme Q10 (CO Q-10) 100 MG CAPS, Omega-3 Fatty Acids (OMEGA 3 500 PO)  No Alcohol for 24 hours before or after surgery.  No Smoking including e-cigarettes for 24 hours prior to surgery.  No chewable tobacco products for at least 6 hours prior to surgery.  No nicotine patches on the day of surgery.  Do not use any "recreational" drugs for at least a week prior to your surgery.  Please be advised that the combination of cocaine and anesthesia may have negative outcomes, up to and including death. If you test positive for cocaine, your surgery will be cancelled.  On the morning of surgery brush your teeth with toothpaste and water, you may rinse your mouth with mouthwash if you wish. Do not swallow any toothpaste or mouthwash.  Do not wear jewelry, make-up, hairpins, clips or nail polish.  Do not wear lotions, powders, or perfumes.   Do not shave body from the neck down 48 hours prior to surgery just in case you cut yourself which could leave a site for infection.  Also, freshly shaved skin may become irritated if using the CHG soap.  Contact lenses, hearing aids and dentures may not be worn  into surgery.  Do not bring valuables to the hospital. [facility] is not responsible for any missing/lost belongings or valuables.   Use CHG Soap or wipes as directed on instruction sheet.  Notify your doctor if there is any change in your medical condition (cold, fever, infection).  Wear comfortable clothing (specific to your surgery type) to the hospital.  Plan for stool softeners for home use; pain  medications have a tendency to cause constipation. You can also help prevent constipation by eating foods high in fiber such as fruits and vegetables and drinking plenty of fluids as your diet allows.  After surgery, you can help prevent lung complications by doing breathing exercises.  Take deep breaths and cough every 1-2 hours. Your doctor may order a device called an Incentive Spirometer to help you take deep breaths. When coughing or sneezing, hold a pillow firmly against your incision with both hands. This is called "splinting." Doing this helps protect your incision. It also decreases belly discomfort.  If you are being admitted to the hospital overnight, leave your suitcase in the car. After surgery it may be brought to your room.  If you are being discharged the day of surgery, you will not be allowed to drive home. You will need a responsible adult (18 years or older) to drive you home and stay with you that night.   If you are taking public transportation, you will need to have a responsible adult (18 years or older) with you. Please confirm with your physician that it is acceptable to use public transportation.   Please call the Pre-admissions Testing Dept. at [phone] if you have any questions about these instructions.  Surgery Visitation Policy:  Patients undergoing a surgery or procedure may have one family member or support person with them as long as that person is not COVID-19 positive or experiencing its symptoms.  That person may remain in the waiting area during the procedure.  Inpatient Visitation:    Visiting hours are 7 a.m. to 8 p.m. Inpatients will be allowed two visitors daily. The visitors may change each day during the patient's stay. No visitors under the age of 22. Any visitor under the age of 33 must be accompanied by an adult. The visitor must pass COVID-19 screenings, use hand sanitizer when entering and exiting the patient's room and wear a mask at  all times, including in the patient's room. Patients must also wear a mask when staff or their visitor are in the room. Masking is required regardless of vaccination status.

## 2021-04-23 ENCOUNTER — Other Ambulatory Visit: Payer: BC Managed Care – PPO

## 2021-04-23 LAB — URINE CULTURE
Culture: NO GROWTH
Special Requests: NORMAL

## 2021-04-30 ENCOUNTER — Other Ambulatory Visit: Payer: BC Managed Care – PPO

## 2021-05-01 ENCOUNTER — Other Ambulatory Visit
Admission: RE | Admit: 2021-05-01 | Discharge: 2021-05-01 | Disposition: A | Discharge status: Home / Self Care | Payer: BC Managed Care – PPO | Source: Ambulatory Visit | Attending: Orthopedic Surgery | Admitting: Orthopedic Surgery

## 2021-05-01 ENCOUNTER — Other Ambulatory Visit: Payer: Self-pay

## 2021-05-01 DIAGNOSIS — Z01812 Encounter for preprocedural laboratory examination: Secondary | ICD-10-CM | POA: Insufficient documentation

## 2021-05-01 DIAGNOSIS — Z20822 Contact with and (suspected) exposure to covid-19: Secondary | ICD-10-CM | POA: Insufficient documentation

## 2021-05-01 LAB — SARS CORONAVIRUS 2 (TAT 6-24 HRS): SARS Coronavirus 2: NEGATIVE

## 2021-05-03 ENCOUNTER — Encounter: Payer: Self-pay | Admitting: Orthopedic Surgery

## 2021-05-03 DIAGNOSIS — Z6831 Body mass index (BMI) 31.0-31.9, adult: Secondary | ICD-10-CM | POA: Insufficient documentation

## 2021-05-03 NOTE — H&P (Signed)
ORTHOPAEDIC HISTORY & PHYSICAL Brooke Martin, [place] - 04/21/2021 4:00 PM EDT Formatting of this note is different from the original. [facility] Chief Complaint:   Chief Complaint  Patient presents with  . Knee Pain  H & P LEFT KNEE   History of Present Illness:   Brooke Martin is a 55 y.o. female that presents to clinic today for her preoperative history and evaluation. Patient presents unaccompanied. The patient is scheduled to undergo a revision left total knee arthroplasty on 05/04/21 by Dr. Ernest Pine. Patient is approximately 4 years status post left total knee arthroplasty performed by Dr. Hinton Dyer. Patient subsequently had issues with arthrofibrosis and underwent left knee arthroscopy with lysis of adhesions and female epilation under anesthesia 2 years ago. Patient has noted progressive decrease in her range of motion as well as increased pain since that time. Patient underwent a three-phase bone scan on 02/02/2021 which showed evidence of loosening of the left knee femoral component. She describes her pain is worse with weightbearing. She reports associated difficulty sitting in seats as well as difficulty with getting in and out of the car due to her limited knee range of motion. She denies associated numbness or tingling.   The patient's symptoms have progressed to the point that they decrease her quality of life. The patient has previously undergone conservative treatment including NSAIDS and Nativity modification without adequate control of her symptoms.  Patient denies history of blood clots, lumbar surgery. She of note, patient has a history of sleep apnea and does use BiPap each evening.   Patient would prefer to start outpatient PT following surgery instead of home health therapy. She states she uses her therapist in [place] and asks about use of a continuous motion machine following surgery.   Past Medical, Surgical, Family, Social  History, Allergies, Medications:   Past Medical History:  Past Medical History:  Diagnosis Date  . Diabetes (CMS-HCC)  . Rash and nonspecific skin eruption  . Sleep apnea   Past Surgical History:  Past Surgical History:  Procedure Laterality Date  . ANTERIOR FUSION CERVICAL SPINE 07/28/2020  Dr. Venetia Maxon  . COLONOSCOPY 10/30/2019  Tubular adenoma of the colon/FHx CC/Repeat 65yrs/TKT  . HYSTERECTOMY TOTAL ABDOMINAL W/REMOVAL TUBES &/OR OVARIES  . Left total knee arthroplasty 09/27/2016  Dr Hinton Dyer  . Manipulation of the left knee under anesthesia, left knee arthroscopy with lysis of adhesions 07/19/2018  Dr Ernest Pine  . metatarsal joint fusion   Current Medications:  Current Outpatient Medications  Medication Sig Dispense Refill  . ALPRAZolam (XANAX) 1 MG tablet Take 1 mg by mouth As needed before dental procedures  . amoxicillin (AMOXIL) 500 MG tablet Take 2,000 mg by mouth 2000 mg (4 capsules) one (1) hour before dental procedure  . aspirin 81 MG EC tablet Take 81 mg by mouth once daily.  Marland Kitchen aspirin/acetaminophen/caffeine (EXCEDRIN EXTRA STRENGTH ORAL) Take 1 tablet by mouth As needed for headaches  . baclofen (LIORESAL) 10 MG tablet 10 mg once daily 6  . carBAMazepine (CARBATROL) 100 MG CR capsule Take 200 mg by mouth 2 (two) times daily  . co-enzyme Q-10, ubiquinone, 100 mg capsule Take 100 mg by mouth once daily  . EPINEPHrine (EPIPEN) 0.3 mg/0.3 mL pen injector Inject 0.3 mg into the muscle once as needed for Anaphylaxis.  Marland Kitchen ergocalciferol, vitamin D2, 50,000 unit capsule 50,000 Units once a week 3  . hydrOXYzine (ATARAX) 25 MG tablet Take 25 mg by mouth nightly.  Marland Kitchen ibuprofen (  ADVIL,MOTRIN) 800 MG tablet Take 800 mg by mouth every 6 (six) hours as needed for Pain.  Marland Kitchen levocetirizine (XYZAL) 5 MG tablet Take 5 mg by mouth nightly as needed  . losartan (COZAAR) 100 MG tablet Take 100 mg by mouth once daily  . meclizine (ANTIVERT) 25 mg tablet Take 25 mg by mouth As needed for  dizziness  . metFORMIN (GLUCOPHAGE) 500 MG tablet 500 mg 2 (two) times daily with meals 3  . methocarbamoL (ROBAXIN) 750 MG tablet Take 750 mg by mouth Prn for muscle spams  . metoprolol succinate (TOPROL-XL) 50 MG XL tablet Take 50 mg by mouth once daily  . multivitamin-lutein (MULTIVITAMIN 50 PLUS) tablet Take 1 tablet by mouth once daily  . NEXLIZET 180-10 mg Tab Take 1 tablet by mouth once daily  . omega-3/dha/epa/fish oil (ULTRA OMEGA-3 ORAL) Take 1 tablet by mouth once daily  . omeprazole (PRILOSEC) 20 MG DR capsule Take 20 mg by mouth once daily as needed For heartburn  . oxybutynin (DITROPAN) 5 mg tablet Take 5 mg by mouth As needed for bladder spasms  . rosuvastatin (CRESTOR) 40 MG tablet Take 40 mg by mouth once daily  . traZODone (DESYREL) 50 MG tablet Take 50 mg by mouth nightly 3  . ZINC CHELATED ORAL Take 1 tablet by mouth once daily   No current facility-administered medications for this visit.   Allergies:  Allergies  Allergen Reactions  . Atorvastatin Calcium Anaphylaxis  . Lipitor [Atorvastatin] Hives  . Other Nausea, Dizziness and Headache  RADIOPHARMACEUTICALS: 22.4 mCi Tc-69m MDP IV -used with bone scan Elevated B/P, Tingling side of face   Social History:  Social History   Socioeconomic History  . Marital status: Married  Spouse name: Dorene Sorrow  . Number of children: 0  . Years of education: 16.5  . Highest education level: Bachelor's degree (e.g., BA, AB, BS)  Occupational History  . 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 Smoking status: Never Smoker  . Smokeless tobacco: Never Used  Vaping Use  . Vaping Use: Never used  Substance and Sexual Activity  . Alcohol use: Yes  Alcohol/week: 1.0 standard drink  Types: 1 Standard drinks or equivalent per week  Comment: 1 x week  . Drug use: Defer  . Sexual activity: Yes  Partners: Male   Family History:  Family History  Problem Relation Age of Onset  . High blood pressure (Hypertension)  Mother   Review of Systems:   A 10+ ROS was performed, reviewed, and the pertinent orthopaedic findings are documented in the HPI.   Physical Examination:   BP 124/84 (BP Location: Left upper arm, Patient Position: Sitting, BP Cuff Size: Adult)  Ht 165.1 cm (5\' 5" )  Wt 81.4 kg (179 lb 6.4 oz)  LMP 08/21/2016  BMI 29.85 kg/m   Patient is a well-developed, well-nourished female in no acute distress. Patient has normal mood and affect. Patient is alert and oriented to person, place, and time.   HEENT: Atraumatic, normocephalic. Pupils equal and reactive to light. Extraocular motion intact. Noninjected sclera.  Cardiovascular: Regular rate and rhythm, with no murmurs, rubs, or gallops. Distal pulses palpable.  Respiratory: Lungs clear to auscultation bilaterally.   LeftKnee: Soft tissue swelling:none Effusion:none Erythema:none Crepitance:none Tenderness:global tenderness. Alignment:normal Mediolateral laxity:stable Atrophy:VMO and vastus medialisatrophy.  Quadriceps tone was fair. Range of Motion:0/1/80degrees Fair patellar mobility.  Sensation intact over the saphenous, lateral sural cutaneous, superficial fibular, and deep fibular nerve distributions.  Tests Performed/Reviewed:  X-rays  3  views of the left knee were obtained. Images reveal left total knee arthroplasty without signs of obvious periprosthetic lucency or periprosthetic fracture. No dislocations noted.  Three-phase bone scan  NUCLEAR MEDICINE 3-PHASE BONE SCAN   TECHNIQUE:  Radionuclide angiographic images, immediate static blood pool  images, and 3-hour delayed static images were obtained of the knees  after intravenous injection of radiopharmaceutical.   RADIOPHARMACEUTICALS: 22.4 mCi Tc-3m MDP IV   COMPARISON: None.    FINDINGS:  Vascular phase: Mild asymmetric early arterial flow to the LEFT knee  is noted above the joint.   Bloodpool phase: Blood pool activity localizing to the LEFT knee  above the joint is similar to hyperemia demonstrated on vascular  phase.   Delayed phase: Intense activity localizing to the distal femoral  condyles and tibial plateau on delayed imaging. Minimal uptake in  the RIGHT knee.   IMPRESSION:  Concern for loosening infection of the LEFT knee prosthetic  involving the femoral component.   Mild positive findings on vascular phase and blood pool phase with  more intense activity on the delayed phase.   Electronically Signed  By: Genevive Bi M.D.  On: 02/02/2021 16:03  Impression:   ICD-10-CM  1. 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 Having failed conservative treatment, the patient has elected to proceed with a revision total joint arthroplasty. The patient will undergo a revision total joint arthroplasty with Dr. Ernest Pine. The risks of surgery, including blood clot and infection, were discussed with the patient. Measures to reduce these risks, including the use of anticoagulation, perioperative antibiotics, and early ambulation were discussed. The importance of postoperative physical therapy was discussed with the patient. As the patient wishes to establish physical therapy postoperatively in [place], an order for physical therapy was provided at today's visit. Importance of starting physical therapy as soon as possible postoperatively was stressed to the patient. The patient elects to proceed with surgery. The patient is instructed to stop all blood thinners prior to surgery. The patient is instructed to call  the hospital the day before surgery to learn of the proper arrival time.   Contact our office with any questions or concerns. Follow up as indicated, or sooner should any new problems arise, if conditions worsen, or if they are otherwise concerned.   Brooke Gardener, PA-C [facility] [address] [place], [place] [address] Phone: [phone]  This note was generated in part with voice recognition software and I apologize for any typographical errors that were not detected and corrected.  Electronically signed by Brooke Gardener, PA at 04/25/2021 1:02 AM EDT

## 2021-05-04 ENCOUNTER — Inpatient Hospital Stay: Payer: BC Managed Care – PPO

## 2021-05-04 ENCOUNTER — Encounter
Admission: RE | Disposition: A | Discharge status: Home / Self Care | Payer: Self-pay | Source: Home / Self Care | Attending: Orthopedic Surgery

## 2021-05-04 ENCOUNTER — Other Ambulatory Visit: Payer: Self-pay

## 2021-05-04 ENCOUNTER — Inpatient Hospital Stay: Payer: BC Managed Care – PPO | Admitting: Certified Registered Nurse Anesthetist

## 2021-05-04 ENCOUNTER — Inpatient Hospital Stay
Admission: RE | Admit: 2021-05-04 | Discharge: 2021-05-06 | DRG: 489 | Disposition: A | Discharge status: Home / Self Care | Payer: BC Managed Care – PPO | Attending: Orthopedic Surgery | Admitting: Orthopedic Surgery

## 2021-05-04 DIAGNOSIS — Z20822 Contact with and (suspected) exposure to covid-19: Secondary | ICD-10-CM | POA: Diagnosis present

## 2021-05-04 DIAGNOSIS — Z7984 Long term (current) use of oral hypoglycemic drugs: Secondary | ICD-10-CM

## 2021-05-04 DIAGNOSIS — Z96652 Presence of left artificial knee joint: Secondary | ICD-10-CM | POA: Diagnosis present

## 2021-05-04 DIAGNOSIS — Z90710 Acquired absence of both cervix and uterus: Secondary | ICD-10-CM

## 2021-05-04 DIAGNOSIS — Z79899 Other long term (current) drug therapy: Secondary | ICD-10-CM

## 2021-05-04 DIAGNOSIS — E119 Type 2 diabetes mellitus without complications: Secondary | ICD-10-CM | POA: Diagnosis present

## 2021-05-04 DIAGNOSIS — Z981 Arthrodesis status: Secondary | ICD-10-CM | POA: Diagnosis not present

## 2021-05-04 DIAGNOSIS — Z888 Allergy status to other drugs, medicaments and biological substances status: Secondary | ICD-10-CM

## 2021-05-04 DIAGNOSIS — Z7982 Long term (current) use of aspirin: Secondary | ICD-10-CM

## 2021-05-04 DIAGNOSIS — G4730 Sleep apnea, unspecified: Secondary | ICD-10-CM | POA: Diagnosis present

## 2021-05-04 DIAGNOSIS — M24662 Ankylosis, left knee: Secondary | ICD-10-CM | POA: Diagnosis present

## 2021-05-04 DIAGNOSIS — Z96659 Presence of unspecified artificial knee joint: Secondary | ICD-10-CM | POA: Diagnosis present

## 2021-05-04 HISTORY — PX: TOTAL KNEE REVISION: SHX996

## 2021-05-04 LAB — ABO/RH: ABO/RH(D): B POS

## 2021-05-04 LAB — GLUCOSE, CAPILLARY: Glucose-Capillary: 118 mg/dL — ABNORMAL HIGH (ref 70–99)

## 2021-05-04 LAB — GRAM STAIN: Gram Stain: NONE SEEN

## 2021-05-04 SURGERY — TOTAL KNEE REVISION
Anesthesia: Spinal | Site: Knee | Laterality: Left

## 2021-05-04 MED ORDER — METFORMIN HCL 500 MG PO TABS
500.0000 mg | ORAL_TABLET | Freq: Every day | ORAL | Status: DC
  Administered 2021-05-05 – 2021-05-06 (×2): 500 mg via ORAL
  Filled 2021-05-04 (×2): qty 1

## 2021-05-04 MED ORDER — ORAL CARE MOUTH RINSE: 15.0000 mL | Freq: Once | OROMUCOSAL | Status: AC

## 2021-05-04 MED ORDER — GABAPENTIN 300 MG PO CAPS: 300.0000 mg | ORAL_CAPSULE | Freq: Once | ORAL | Status: AC

## 2021-05-04 MED ORDER — FENTANYL CITRATE (PF) 100 MCG/2ML IJ SOLN
INTRAMUSCULAR | Status: AC
  Filled 2021-05-04: qty 2

## 2021-05-04 MED ORDER — PROPOFOL 500 MG/50ML IV EMUL
INTRAVENOUS | Status: DC | PRN
  Administered 2021-05-04: 55 ug/kg/min via INTRAVENOUS

## 2021-05-04 MED ORDER — TETRACAINE HCL 1 % IJ SOLN
INTRAMUSCULAR | Status: DC | PRN
  Administered 2021-05-04: 5 mg via INTRASPINAL

## 2021-05-04 MED ORDER — LOSARTAN POTASSIUM 50 MG PO TABS
100.0000 mg | ORAL_TABLET | Freq: Every day | ORAL | Status: DC
  Administered 2021-05-04 – 2021-05-06 (×3): 100 mg via ORAL
  Filled 2021-05-04 (×3): qty 2

## 2021-05-04 MED ORDER — ACETAMINOPHEN 325 MG PO TABS: 325.0000 mg | ORAL_TABLET | Freq: Four times a day (QID) | ORAL | Status: DC | PRN

## 2021-05-04 MED ORDER — ADULT MULTIVITAMIN W/MINERALS CH
1.0000 | ORAL_TABLET | Freq: Every day | ORAL | Status: DC
  Administered 2021-05-04 – 2021-05-06 (×3): 1 via ORAL
  Filled 2021-05-04 (×3): qty 1

## 2021-05-04 MED ORDER — PANTOPRAZOLE SODIUM 40 MG PO TBEC
40.0000 mg | DELAYED_RELEASE_TABLET | Freq: Two times a day (BID) | ORAL | Status: DC
  Administered 2021-05-04 – 2021-05-06 (×4): 40 mg via ORAL
  Filled 2021-05-04 (×4): qty 1

## 2021-05-04 MED ORDER — DEXMEDETOMIDINE (PRECEDEX) IN NS 20 MCG/5ML (4 MCG/ML) IV SYRINGE
PREFILLED_SYRINGE | INTRAVENOUS | Status: AC
  Filled 2021-05-04: qty 10

## 2021-05-04 MED ORDER — METOCLOPRAMIDE HCL 10 MG PO TABS
10.0000 mg | ORAL_TABLET | Freq: Three times a day (TID) | ORAL | Status: DC
  Administered 2021-05-04 – 2021-05-06 (×6): 10 mg via ORAL
  Filled 2021-05-04 (×6): qty 1

## 2021-05-04 MED ORDER — HYDROMORPHONE HCL 1 MG/ML IJ SOLN: 0.5000 mg | INTRAMUSCULAR | Status: DC | PRN

## 2021-05-04 MED ORDER — ACETAMINOPHEN 10 MG/ML IV SOLN
1000.0000 mg | Freq: Four times a day (QID) | INTRAVENOUS | Status: AC
  Administered 2021-05-04 – 2021-05-05 (×4): 1000 mg via INTRAVENOUS
  Filled 2021-05-04 (×4): qty 100

## 2021-05-04 MED ORDER — HYDROXYZINE HCL 25 MG PO TABS
25.0000 mg | ORAL_TABLET | Freq: Every evening | ORAL | Status: DC | PRN
  Filled 2021-05-04: qty 1

## 2021-05-04 MED ORDER — PROPOFOL 10 MG/ML IV BOLUS
INTRAVENOUS | Status: DC | PRN
  Administered 2021-05-04: 60 mg via INTRAVENOUS
  Administered 2021-05-04: 40 mg via INTRAVENOUS

## 2021-05-04 MED ORDER — PROPOFOL 1000 MG/100ML IV EMUL
INTRAVENOUS | Status: AC
  Filled 2021-05-04: qty 100

## 2021-05-04 MED ORDER — LIDOCAINE HCL (CARDIAC) PF 100 MG/5ML IV SOSY
PREFILLED_SYRINGE | INTRAVENOUS | Status: DC | PRN
  Administered 2021-05-04: 80 mg via INTRAVENOUS

## 2021-05-04 MED ORDER — EPINEPHRINE 0.3 MG/0.3ML IJ SOAJ
0.3000 mg | Freq: Once | INTRAMUSCULAR | Status: DC | PRN
  Filled 2021-05-04: qty 0.6

## 2021-05-04 MED ORDER — DEXAMETHASONE SODIUM PHOSPHATE 10 MG/ML IJ SOLN: 8.0000 mg | Freq: Once | INTRAMUSCULAR | Status: AC

## 2021-05-04 MED ORDER — DEXAMETHASONE SODIUM PHOSPHATE 10 MG/ML IJ SOLN
INTRAMUSCULAR | Status: AC
  Administered 2021-05-04: 8 mg via INTRAVENOUS
  Filled 2021-05-04: qty 1

## 2021-05-04 MED ORDER — BACLOFEN 10 MG PO TABS
10.0000 mg | ORAL_TABLET | Freq: Every day | ORAL | Status: DC
  Administered 2021-05-04 – 2021-05-05 (×2): 10 mg via ORAL
  Filled 2021-05-04 (×3): qty 1

## 2021-05-04 MED ORDER — DEXMEDETOMIDINE (PRECEDEX) IN NS 20 MCG/5ML (4 MCG/ML) IV SYRINGE
PREFILLED_SYRINGE | INTRAVENOUS | Status: DC | PRN
  Administered 2021-05-04: 12 ug via INTRAVENOUS
  Administered 2021-05-04: 20 ug via INTRAVENOUS

## 2021-05-04 MED ORDER — SODIUM CHLORIDE 0.9 % IV SOLN
INTRAVENOUS | Status: DC | PRN
  Administered 2021-05-04: 60 mL

## 2021-05-04 MED ORDER — ONDANSETRON HCL 4 MG/2ML IJ SOLN: 4.0000 mg | Freq: Once | INTRAMUSCULAR | Status: DC | PRN

## 2021-05-04 MED ORDER — VITAMIN D (ERGOCALCIFEROL) 1.25 MG (50000 UNIT) PO CAPS
50000.0000 [IU] | ORAL_CAPSULE | ORAL | Status: DC
  Administered 2021-05-04: 50000 [IU] via ORAL
  Filled 2021-05-04: qty 1

## 2021-05-04 MED ORDER — BISACODYL 10 MG RE SUPP: 10.0000 mg | Freq: Every day | RECTAL | Status: DC | PRN

## 2021-05-04 MED ORDER — MIDAZOLAM HCL 2 MG/2ML IJ SOLN
INTRAMUSCULAR | Status: AC
  Filled 2021-05-04: qty 2

## 2021-05-04 MED ORDER — SODIUM CHLORIDE 0.9 % IV SOLN: INTRAVENOUS | Status: DC

## 2021-05-04 MED ORDER — TRAMADOL HCL 50 MG PO TABS
50.0000 mg | ORAL_TABLET | ORAL | Status: DC | PRN
  Administered 2021-05-06: 100 mg via ORAL
  Filled 2021-05-04: qty 2

## 2021-05-04 MED ORDER — GUAIFENESIN ER 600 MG PO TB12
600.0000 mg | ORAL_TABLET | Freq: Two times a day (BID) | ORAL | Status: DC | PRN
  Administered 2021-05-05: 600 mg via ORAL
  Filled 2021-05-04: qty 1

## 2021-05-04 MED ORDER — LEVOCETIRIZINE DIHYDROCHLORIDE 5 MG PO TABS: 5.0000 mg | ORAL_TABLET | Freq: Every day | ORAL | Status: DC | PRN

## 2021-05-04 MED ORDER — METOPROLOL SUCCINATE ER 50 MG PO TB24
50.0000 mg | ORAL_TABLET | Freq: Every day | ORAL | Status: DC
  Administered 2021-05-05 – 2021-05-06 (×2): 50 mg via ORAL
  Filled 2021-05-04 (×2): qty 1

## 2021-05-04 MED ORDER — FENOFIBRATE 160 MG PO TABS
160.0000 mg | ORAL_TABLET | Freq: Every day | ORAL | Status: DC
  Administered 2021-05-04 – 2021-05-06 (×3): 160 mg via ORAL
  Filled 2021-05-04 (×3): qty 1

## 2021-05-04 MED ORDER — TETRACAINE HCL 1 % IJ SOLN
INTRAMUSCULAR | Status: AC
  Filled 2021-05-04: qty 2

## 2021-05-04 MED ORDER — LIDOCAINE HCL (PF) 2 % IJ SOLN
INTRAMUSCULAR | Status: AC
  Filled 2021-05-04: qty 5

## 2021-05-04 MED ORDER — FENTANYL CITRATE (PF) 100 MCG/2ML IJ SOLN
INTRAMUSCULAR | Status: DC | PRN
  Administered 2021-05-04 (×2): 25 ug via INTRAVENOUS
  Administered 2021-05-04: 50 ug via INTRAVENOUS

## 2021-05-04 MED ORDER — OXYCODONE HCL 5 MG PO TABS
5.0000 mg | ORAL_TABLET | ORAL | Status: DC | PRN
Start: 2021-05-04 — End: 2021-05-06
  Administered 2021-05-04: 5 mg via ORAL
  Filled 2021-05-04: qty 1

## 2021-05-04 MED ORDER — METHOCARBAMOL 500 MG PO TABS: 750.0000 mg | ORAL_TABLET | Freq: Four times a day (QID) | ORAL | Status: DC | PRN

## 2021-05-04 MED ORDER — ALUM & MAG HYDROXIDE-SIMETH 200-200-20 MG/5ML PO SUSP
30.0000 mL | ORAL | Status: DC | PRN
  Filled 2021-05-04: qty 30

## 2021-05-04 MED ORDER — DIPHENHYDRAMINE HCL 12.5 MG/5ML PO ELIX
12.5000 mg | ORAL_SOLUTION | ORAL | Status: DC | PRN
Start: 2021-05-04 — End: 2021-05-06

## 2021-05-04 MED ORDER — OMEGA-3-ACID ETHYL ESTERS 1 G PO CAPS
1.0000 | ORAL_CAPSULE | Freq: Every day | ORAL | Status: DC
  Administered 2021-05-04 – 2021-05-06 (×3): 1 g via ORAL
  Filled 2021-05-04 (×3): qty 1

## 2021-05-04 MED ORDER — ROSUVASTATIN CALCIUM 20 MG PO TABS
40.0000 mg | ORAL_TABLET | Freq: Every day | ORAL | Status: DC
  Administered 2021-05-04 – 2021-05-05 (×2): 40 mg via ORAL
  Filled 2021-05-04: qty 2
  Filled 2021-05-04 (×2): qty 4
  Filled 2021-05-04 (×2): qty 2

## 2021-05-04 MED ORDER — EZETIMIBE 10 MG PO TABS
10.0000 mg | ORAL_TABLET | Freq: Every day | ORAL | Status: DC
  Administered 2021-05-04 – 2021-05-06 (×3): 10 mg via ORAL
  Filled 2021-05-04 (×3): qty 1

## 2021-05-04 MED ORDER — TRANEXAMIC ACID-NACL 1000-0.7 MG/100ML-% IV SOLN
1000.0000 mg | INTRAVENOUS | Status: AC
  Administered 2021-05-04: 1000 mg via INTRAVENOUS

## 2021-05-04 MED ORDER — EPHEDRINE SULFATE 50 MG/ML IJ SOLN
INTRAMUSCULAR | Status: DC | PRN
  Administered 2021-05-04 (×2): 5 mg via INTRAVENOUS

## 2021-05-04 MED ORDER — MENTHOL 3 MG MT LOZG
1.0000 | LOZENGE | OROMUCOSAL | Status: DC | PRN
  Filled 2021-05-04: qty 9

## 2021-05-04 MED ORDER — OXYBUTYNIN CHLORIDE 5 MG PO TABS
5.0000 mg | ORAL_TABLET | Freq: Every day | ORAL | Status: DC | PRN
  Filled 2021-05-04: qty 1

## 2021-05-04 MED ORDER — BEMPEDOIC ACID-EZETIMIBE 180-10 MG PO TABS: 1.0000 | ORAL_TABLET | Freq: Every day | ORAL | Status: DC

## 2021-05-04 MED ORDER — OXYCODONE HCL 5 MG/5ML PO SOLN: 5.0000 mg | Freq: Once | ORAL | Status: AC | PRN

## 2021-05-04 MED ORDER — PROPOFOL 10 MG/ML IV BOLUS
INTRAVENOUS | Status: AC
  Filled 2021-05-04: qty 20

## 2021-05-04 MED ORDER — CEFAZOLIN SODIUM-DEXTROSE 2-4 GM/100ML-% IV SOLN
2.0000 g | INTRAVENOUS | Status: AC
  Administered 2021-05-04: 2 g via INTRAVENOUS

## 2021-05-04 MED ORDER — ONDANSETRON HCL 4 MG PO TABS: 4.0000 mg | ORAL_TABLET | Freq: Four times a day (QID) | ORAL | Status: DC | PRN

## 2021-05-04 MED ORDER — NEOMYCIN-POLYMYXIN B GU 40-200000 IR SOLN
Status: DC | PRN
  Administered 2021-05-04: 16 mL

## 2021-05-04 MED ORDER — TRANEXAMIC ACID-NACL 1000-0.7 MG/100ML-% IV SOLN: 1000.0000 mg | Freq: Once | INTRAVENOUS | Status: AC

## 2021-05-04 MED ORDER — CHLORHEXIDINE GLUCONATE 0.12 % MT SOLN
OROMUCOSAL | Status: AC
  Administered 2021-05-04: 15 mL via OROMUCOSAL
  Filled 2021-05-04: qty 15

## 2021-05-04 MED ORDER — TRAZODONE HCL 50 MG PO TABS
50.0000 mg | ORAL_TABLET | Freq: Every day | ORAL | Status: DC
  Administered 2021-05-04 – 2021-05-05 (×2): 50 mg via ORAL
  Filled 2021-05-04 (×2): qty 1

## 2021-05-04 MED ORDER — OXYCODONE HCL 5 MG PO TABS
5.0000 mg | ORAL_TABLET | Freq: Once | ORAL | Status: AC | PRN
Start: 2021-05-04 — End: 2021-05-04
  Administered 2021-05-04: 5 mg via ORAL

## 2021-05-04 MED ORDER — BUPIVACAINE HCL (PF) 0.5 % IJ SOLN
INTRAMUSCULAR | Status: DC | PRN
  Administered 2021-05-04: 2.5 mL

## 2021-05-04 MED ORDER — SODIUM CHLORIDE FLUSH 0.9 % IV SOLN
INTRAVENOUS | Status: AC
  Filled 2021-05-04: qty 10

## 2021-05-04 MED ORDER — SODIUM CHLORIDE 0.9 % IV SOLN
INTRAVENOUS | Status: DC | PRN
  Administered 2021-05-04: 25 ug/min via INTRAVENOUS

## 2021-05-04 MED ORDER — GABAPENTIN 300 MG PO CAPS
ORAL_CAPSULE | ORAL | Status: AC
  Administered 2021-05-04: 300 mg via ORAL
  Filled 2021-05-04: qty 1

## 2021-05-04 MED ORDER — FLEET ENEMA 7-19 GM/118ML RE ENEM: 1.0000 | ENEMA | Freq: Once | RECTAL | Status: DC | PRN

## 2021-05-04 MED ORDER — ACETAMINOPHEN 10 MG/ML IV SOLN
INTRAVENOUS | Status: DC | PRN
  Administered 2021-05-04: 1000 mg via INTRAVENOUS

## 2021-05-04 MED ORDER — CELECOXIB 200 MG PO CAPS
200.0000 mg | ORAL_CAPSULE | Freq: Two times a day (BID) | ORAL | Status: DC
  Administered 2021-05-04 – 2021-05-06 (×4): 200 mg via ORAL
  Filled 2021-05-04 (×4): qty 1

## 2021-05-04 MED ORDER — TRANEXAMIC ACID-NACL 1000-0.7 MG/100ML-% IV SOLN
INTRAVENOUS | Status: AC
  Administered 2021-05-04: 1000 mg via INTRAVENOUS
  Filled 2021-05-04: qty 100

## 2021-05-04 MED ORDER — MIDAZOLAM HCL 5 MG/5ML IJ SOLN
INTRAMUSCULAR | Status: DC | PRN
  Administered 2021-05-04: 2 mg via INTRAVENOUS

## 2021-05-04 MED ORDER — SURGIRINSE WOUND IRRIGATION SYSTEM - OPTIME
TOPICAL | Status: DC | PRN
  Administered 2021-05-04: 450 mL via TOPICAL

## 2021-05-04 MED ORDER — SENNOSIDES-DOCUSATE SODIUM 8.6-50 MG PO TABS
1.0000 | ORAL_TABLET | Freq: Two times a day (BID) | ORAL | Status: DC
  Administered 2021-05-04 – 2021-05-05 (×3): 1 via ORAL
  Filled 2021-05-04 (×4): qty 1

## 2021-05-04 MED ORDER — ONDANSETRON HCL 4 MG/2ML IJ SOLN: 4.0000 mg | Freq: Four times a day (QID) | INTRAMUSCULAR | Status: DC | PRN

## 2021-05-04 MED ORDER — CARBAMAZEPINE ER 200 MG PO TB12
200.0000 mg | ORAL_TABLET | Freq: Two times a day (BID) | ORAL | Status: DC
  Administered 2021-05-04 – 2021-05-06 (×4): 200 mg via ORAL
  Filled 2021-05-04 (×5): qty 1

## 2021-05-04 MED ORDER — CHLORHEXIDINE GLUCONATE 4 % EX LIQD: 60.0000 mL | Freq: Once | CUTANEOUS | Status: DC

## 2021-05-04 MED ORDER — OXYCODONE HCL 5 MG PO TABS
ORAL_TABLET | ORAL | Status: AC
  Filled 2021-05-04: qty 1

## 2021-05-04 MED ORDER — PHENOL 1.4 % MT LIQD
1.0000 | OROMUCOSAL | Status: DC | PRN
  Filled 2021-05-04: qty 177

## 2021-05-04 MED ORDER — CHLORHEXIDINE GLUCONATE 0.12 % MT SOLN: 15.0000 mL | Freq: Once | OROMUCOSAL | Status: AC

## 2021-05-04 MED ORDER — FERROUS SULFATE 325 (65 FE) MG PO TABS
325.0000 mg | ORAL_TABLET | Freq: Two times a day (BID) | ORAL | Status: DC
  Administered 2021-05-04 – 2021-05-06 (×4): 325 mg via ORAL
  Filled 2021-05-04 (×4): qty 1

## 2021-05-04 MED ORDER — CELECOXIB 200 MG PO CAPS: 400.0000 mg | ORAL_CAPSULE | Freq: Once | ORAL | Status: AC

## 2021-05-04 MED ORDER — TRANEXAMIC ACID-NACL 1000-0.7 MG/100ML-% IV SOLN
INTRAVENOUS | Status: AC
  Filled 2021-05-04: qty 100

## 2021-05-04 MED ORDER — CELECOXIB 200 MG PO CAPS
ORAL_CAPSULE | ORAL | Status: AC
  Administered 2021-05-04: 400 mg via ORAL
  Filled 2021-05-04: qty 2

## 2021-05-04 MED ORDER — PHENYLEPHRINE HCL (PRESSORS) 10 MG/ML IV SOLN
INTRAVENOUS | Status: DC | PRN
  Administered 2021-05-04: 100 ug via INTRAVENOUS

## 2021-05-04 MED ORDER — BUPIVACAINE HCL 0.25 % IJ SOLN
INTRAMUSCULAR | Status: DC | PRN
  Administered 2021-05-04: 60 mL

## 2021-05-04 MED ORDER — OXYCODONE HCL 5 MG PO TABS
10.0000 mg | ORAL_TABLET | ORAL | Status: DC | PRN
  Administered 2021-05-05 – 2021-05-06 (×3): 10 mg via ORAL
  Filled 2021-05-04 (×4): qty 2

## 2021-05-04 MED ORDER — ACETAMINOPHEN 10 MG/ML IV SOLN
INTRAVENOUS | Status: AC
  Filled 2021-05-04: qty 100

## 2021-05-04 MED ORDER — FENTANYL CITRATE (PF) 100 MCG/2ML IJ SOLN: 25.0000 ug | INTRAMUSCULAR | Status: DC | PRN

## 2021-05-04 MED ORDER — MAGNESIUM HYDROXIDE 400 MG/5ML PO SUSP
30.0000 mL | Freq: Every day | ORAL | Status: DC
  Administered 2021-05-05: 30 mL via ORAL
  Filled 2021-05-04 (×2): qty 30

## 2021-05-04 MED ORDER — CEFAZOLIN SODIUM-DEXTROSE 2-4 GM/100ML-% IV SOLN
2.0000 g | Freq: Four times a day (QID) | INTRAVENOUS | Status: AC
Start: 2021-05-04 — End: 2021-05-05
  Administered 2021-05-04 – 2021-05-05 (×2): 2 g via INTRAVENOUS
  Filled 2021-05-04 (×2): qty 100

## 2021-05-04 MED ORDER — ENOXAPARIN SODIUM 30 MG/0.3ML IJ SOSY
30.0000 mg | PREFILLED_SYRINGE | Freq: Two times a day (BID) | INTRAMUSCULAR | Status: DC
  Administered 2021-05-05 – 2021-05-06 (×3): 30 mg via SUBCUTANEOUS
  Filled 2021-05-04 (×3): qty 0.3

## 2021-05-04 MED ORDER — CEFAZOLIN SODIUM-DEXTROSE 2-4 GM/100ML-% IV SOLN
INTRAVENOUS | Status: AC
  Filled 2021-05-04: qty 100

## 2021-05-04 SURGICAL SUPPLY — 76 items
ATTUNE PSRP INSR SZ5 6 KNEE (Insert) ×1 IMPLANT
BLADE OSCILLATING/SAGITTAL (BLADE) ×1
BLADE SAGITTAL 25.0X1.19X90 (BLADE) ×2 IMPLANT
BLADE SAW 70X12.5 (BLADE) ×2 IMPLANT
BLADE SAW 90X13X1.19 OSCILLAT (BLADE) ×2 IMPLANT
BLADE SAW 90X25X1.19 OSCILLAT (BLADE) ×2 IMPLANT
BLADE SAW SGTL 13X75X1.27 (BLADE) ×2 IMPLANT
BLADE SW THK.38XMED LNG THN (BLADE) ×1 IMPLANT
BONE CEMENT GENTAMICIN (Cement) IMPLANT
CANISTER SUCT 1200ML W/VALVE (MISCELLANEOUS) ×2 IMPLANT
CATH COUDE FOLEY 5CC 14FR (CATHETERS) ×1 IMPLANT
CEMENT BONE GENTAMICIN 40 (Cement) IMPLANT
CNTNR SPEC 2.5X3XGRAD LEK (MISCELLANEOUS) ×1
CONT SPEC 4OZ STER OR WHT (MISCELLANEOUS) ×1
CONTAINER SPEC 2.5X3XGRAD LEK (MISCELLANEOUS) ×1 IMPLANT
COOLER POLAR GLACIER W/PUMP (MISCELLANEOUS) ×2 IMPLANT
COVER BACK TABLE REUSABLE LG (DRAPES) ×2 IMPLANT
COVER WAND RF STERILE (DRAPES) ×2 IMPLANT
CUFF TOURN SGL QUICK 24 (TOURNIQUET CUFF) ×1
CUFF TRNQT CYL 24X4X16.5-23 (TOURNIQUET CUFF) IMPLANT
DRAPE 3/4 80X56 (DRAPES) ×4 IMPLANT
DRSG DERMACEA 8X12 NADH (GAUZE/BANDAGES/DRESSINGS) ×2 IMPLANT
DRSG MEPILEX SACRM 8.7X9.8 (GAUZE/BANDAGES/DRESSINGS) ×2 IMPLANT
DRSG OPSITE POSTOP 4X14 (GAUZE/BANDAGES/DRESSINGS) ×2 IMPLANT
DRSG TEGADERM 4X4.75 (GAUZE/BANDAGES/DRESSINGS) ×1 IMPLANT
DURAPREP 26ML APPLICATOR (WOUND CARE) ×2 IMPLANT
ELECT CAUTERY BLADE 6.4 (BLADE) ×2 IMPLANT
ELECT REM PT RETURN 9FT ADLT (ELECTROSURGICAL) ×2
ELECTRODE REM PT RTRN 9FT ADLT (ELECTROSURGICAL) ×1 IMPLANT
GAUZE SPONGE 4X4 12PLY STRL (GAUZE/BANDAGES/DRESSINGS) ×2 IMPLANT
GLOVE SURG ENC TEXT LTX SZ7.5 (GLOVE) ×2 IMPLANT
GLOVE SURG UNDER LTX SZ8 (GLOVE) ×2 IMPLANT
GOWN STRL REUS W/ TWL LRG LVL3 (GOWN DISPOSABLE) ×2 IMPLANT
GOWN STRL REUS W/TWL LRG LVL3 (GOWN DISPOSABLE) ×2
HANDPIECE VERSAJET DEBRIDEMENT (MISCELLANEOUS) ×2 IMPLANT
HEMOVAC 400CC 10FR (MISCELLANEOUS) ×2 IMPLANT
HOOD PEEL AWAY FLYTE STAYCOOL (MISCELLANEOUS) ×4 IMPLANT
IRRIGATION STRYKERFLOW (MISCELLANEOUS) ×1 IMPLANT
IRRIGATOR STRYKERFLOW (MISCELLANEOUS) ×2
IV NS IRRIG 3000ML ARTHROMATIC (IV SOLUTION) ×2 IMPLANT
KIT TURNOVER KIT A (KITS) ×2 IMPLANT
KNIFE SCULPS 14X20 (INSTRUMENTS) ×2 IMPLANT
MANIFOLD NEPTUNE II (INSTRUMENTS) ×4 IMPLANT
NDL SAFETY ECLIPSE 18X1.5 (NEEDLE) ×1 IMPLANT
NDL SPNL 18GX3.5 QUINCKE PK (NEEDLE) ×1 IMPLANT
NDL SPNL 20GX3.5 QUINCKE YW (NEEDLE) ×2 IMPLANT
NEEDLE HYPO 18GX1.5 SHARP (NEEDLE) ×1
NEEDLE SPNL 18GX3.5 QUINCKE PK (NEEDLE) ×2 IMPLANT
NEEDLE SPNL 20GX3.5 QUINCKE YW (NEEDLE) ×4 IMPLANT
NS IRRIG 1000ML POUR BTL (IV SOLUTION) ×2 IMPLANT
PACK TOTAL KNEE (MISCELLANEOUS) ×2 IMPLANT
PAD ABD DERMACEA PRESS 5X9 (GAUZE/BANDAGES/DRESSINGS) ×2 IMPLANT
PAD WRAPON POLAR KNEE (MISCELLANEOUS) ×1 IMPLANT
PENCIL SMOKE EVACUATOR COATED (MISCELLANEOUS) ×2 IMPLANT
PULSAVAC PLUS IRRIG FAN TIP (DISPOSABLE) ×2
SPONGE DRAIN TRACH 4X4 STRL 2S (GAUZE/BANDAGES/DRESSINGS) ×1 IMPLANT
SPONGE LAP 18X18 RF (DISPOSABLE) ×2 IMPLANT
STAPLER SKIN PROX 35W (STAPLE) ×2 IMPLANT
SUCTION FRAZIER HANDLE 10FR (MISCELLANEOUS) ×1
SUCTION TUBE FRAZIER 10FR DISP (MISCELLANEOUS) ×1 IMPLANT
SUT MNCRL 0 1X36 CT-1 (SUTURE) ×1 IMPLANT
SUT MON AB 2-0 CT1 36 (SUTURE) ×2 IMPLANT
SUT MONOCRYL 0 (SUTURE) ×1
SUT PROLENE 1 CT 1 30 (SUTURE) ×2 IMPLANT
SUT VIC AB 0 CT1 36 (SUTURE) ×2 IMPLANT
SUT VIC AB 1 CT1 36 (SUTURE) ×4 IMPLANT
SUT VIC AB 2-0 CT1 (SUTURE) ×2 IMPLANT
SWAB CULTURE AMIES ANAERIB BLU (MISCELLANEOUS) ×12 IMPLANT
SYR 20ML LL LF (SYRINGE) ×2 IMPLANT
SYR 30ML LL (SYRINGE) ×4 IMPLANT
TIP BRUSH PULSAVAC PLUS 24.33 (MISCELLANEOUS) ×2 IMPLANT
TIP FAN IRRIG PULSAVAC PLUS (DISPOSABLE) ×1 IMPLANT
TOWEL OR 17X26 4PK STRL BLUE (TOWEL DISPOSABLE) ×2 IMPLANT
TOWER CARTRIDGE SMART MIX (DISPOSABLE) ×2 IMPLANT
TRAY FOLEY MTR SLVR 16FR STAT (SET/KITS/TRAYS/PACK) ×2 IMPLANT
WRAPON POLAR PAD KNEE (MISCELLANEOUS) ×2

## 2021-05-04 NOTE — Plan of Care (Signed)

## 2021-05-04 NOTE — Transfer of Care (Signed)
Immediate Anesthesia Transfer of Care Note  Patient: Brooke Martin  Procedure(s) Performed: TOTAL KNEE REVISION (Left Knee)  Patient Location: PACU  Anesthesia Type:Spinal  Level of Consciousness: awake, alert  and oriented  Airway & Oxygen Therapy: Patient Spontanous Breathing  Post-op Assessment: Report given to RN and Post -op Vital signs reviewed and stable  Post vital signs: Reviewed and stable  Last Vitals:  Vitals Value Taken Time  BP 119/74 05/04/21 1500  Temp    Pulse 79 05/04/21 1500  Resp 23 05/04/21 1500  SpO2 98 % 05/04/21 1500    Last Pain:  Vitals:   05/04/21 1005  TempSrc: Temporal  PainSc: 0-No pain         Complications: No complications documented.

## 2021-05-04 NOTE — Anesthesia Procedure Notes (Signed)
Date/Time: 05/04/2021 11:45 AM Performed by: Ginger Carne, CRNA Pre-anesthesia Checklist: Patient identified, Emergency Drugs available, Suction available, Patient being monitored and Timeout performed Patient Re-evaluated:Patient Re-evaluated prior to induction Oxygen Delivery Method: Simple face mask Preoxygenation: Pre-oxygenation with 100% oxygen Induction Type: IV induction

## 2021-05-04 NOTE — H&P (Signed)
The patient has been re-examined, and the chart reviewed, and there have been no interval changes to the documented history and physical.    The risks, benefits, and alternatives have been discussed at length. The patient expressed understanding of the risks benefits and agreed with plans for surgical intervention.  Tamitha Norell P. Deisha Stull, Jr. M.D.    

## 2021-05-04 NOTE — Anesthesia Procedure Notes (Signed)
Spinal  Patient location during procedure: OR Start time: 05/04/2021 11:35 AM End time: 05/04/2021 11:41 AM Reason for block: surgical anesthesia Staffing Performed: anesthesiologist and resident/CRNA  Anesthesiologist: Tera Mater, MD Resident/CRNA: Johnna Acosta, CRNA Preanesthetic Checklist Completed: patient identified, IV checked, site marked, risks and benefits discussed, surgical consent, monitors and equipment checked, pre-op evaluation and timeout performed Spinal Block Patient position: sitting Prep: Betadine Patient monitoring: heart rate, continuous pulse ox, blood pressure and cardiac monitor Approach: midline Location: L4-5 Injection technique: single-shot Needle Needle type: Whitacre and Introducer  Needle gauge: 24 G Needle length: 9 cm Assessment Sensory level: T10 Events: CSF return Additional Notes Negative paresthesia. Negative blood return. Positive free-flowing CSF. Expiration date of kit checked and confirmed. Patient tolerated procedure well, without complications.

## 2021-05-04 NOTE — Anesthesia Preprocedure Evaluation (Addendum)
Anesthesia Evaluation  Patient identified by MRN, date of birth, ID band Patient awake    Reviewed: Allergy & Precautions, H&P , NPO status , Patient's Chart, lab work & pertinent test results  History of Anesthesia Complications Negative for: history of anesthetic complications  Airway Mallampati: II  TM Distance: <3 FB Neck ROM: limited    Dental  (+) Teeth Intact   Pulmonary sleep apnea and Continuous Positive Airway Pressure Ventilation , neg COPD,    breath sounds clear to auscultation       Cardiovascular hypertension, (-) angina(-) Past MI and (-) Cardiac Stents (-) dysrhythmias  Rhythm:regular Rate:Normal     Neuro/Psych  Headaches, H/o cervical spine fusion  negative psych ROS   GI/Hepatic negative GI ROS, Neg liver ROS,   Endo/Other  diabetes  Renal/GU      Musculoskeletal   Abdominal   Peds  Hematology negative hematology ROS (+)   Anesthesia Other Findings Past Medical History: No date: Adverse effect of anesthesia, initial encounter     Comment:  had bad headaches with many procedures, and sob No date: Arthritis No date: Complication of anesthesia     Comment:  hard a hard time numbing the toe, and had a hard time               going to sleep with sedation No date: Diabetes mellitus without complication (HCC) No date: Dyspnea     Comment:  after surgery No date: Family history of adverse reaction to anesthesia     Comment:  ponv No date: Hypercholesterolemia No date: Hypertension No date: Sleep apnea     Comment:  uses cpap  Past Surgical History: 2020: ABDOMINAL HYSTERECTOMY 07/2020: BACK SURGERY     Comment:  fusion of 2 disc 07/2020: carpel tunnel     Comment:  right 2021: COLONOSCOPY 06/30/2018: DILATION AND CURETTAGE OF UTERUS No date: JOINT REPLACEMENT; Left     Comment:  KNEE 07/19/2018: KNEE ARTHROSCOPY; Left     Comment:  Procedure: ARTHROSCOPY KNEE WITH EXTENSIVE SYNOVECTOMY                AND LYSIS OF ADHESIONS;  Surgeon: Donato Heinz, MD;                Location: [facility] ORS;  Service: Orthopedics;  Laterality:               Left; 02/2018: TOE FUSION; Left No date: WISDOM TOOTH EXTRACTION  BMI    Body Mass Index: 30.23 kg/m      Reproductive/Obstetrics negative OB ROS                            Anesthesia Physical Anesthesia Plan  ASA: III  Anesthesia Plan: Spinal   Post-op Pain Management:    Induction:   PONV Risk Score and Plan: Propofol infusion and Ondansetron  Airway Management Planned: Simple Face Mask  Additional Equipment:   Intra-op Plan:   Post-operative Plan:   Informed Consent: I have reviewed the patients History and Physical, chart, labs and discussed the procedure including the risks, benefits and alternatives for the proposed anesthesia with the patient or authorized representative who has indicated his/her understanding and acceptance.     Dental Advisory Given  Plan Discussed with: Anesthesiologist, CRNA and Surgeon  Anesthesia Plan Comments:        Anesthesia Quick Evaluation

## 2021-05-04 NOTE — Op Note (Signed)
OPERATIVE NOTE  DATE OF SURGERY:  05/04/2021  PATIENT NAME:  Brooke Martin   DOB: 09/27/1966  MRN: [id number]  PRE-OPERATIVE DIAGNOSIS: Arthrofibrosis, possible prosthetic loosening status post left total knee arthroplasty  POST-OPERATIVE DIAGNOSIS: Arthrofibrosis of the left knee  PROCEDURE: Extensive synovectomy and lysis of adhesions, replacement of the polyethylene insert of the left knee  SURGEON:  Jena Gauss. M.D.  ASSISTANT: Baldwin Jamaica, PA-C (present and scrubbed throughout the case, critical for assistance with exposure, retraction, instrumentation, and closure)  ANESTHESIA: spinal  ESTIMATED BLOOD LOSS: 25 mL  FLUIDS REPLACED: 800 mL of crystalloid  TOURNIQUET TIME: 72 minutes  DRAINS: 2 medium Hemovac drains  IMPLANTS UTILIZED: DePuy Attune size 5 6 mm stabilized rotating platform polyethylene insert.  INDICATIONS FOR SURGERY: Brooke Martin is a 55 y.o. year old female who has remote history of left total knee arthroplasty.  She has had significant restriction with her knee range of motion despite manipulation under anesthesia and arthroscopic debridement.  Three-phase bone scan demonstrated increased uptake suggestive of loosening of the femoral component.. After discussion of the risks and benefits of surgical intervention, the patient expressed understanding of the risks benefits and agree with plans for total knee revision arthroplasty.   The risks, benefits, and alternatives were discussed at length including but not limited to the risks of infection, bleeding, nerve injury, stiffness, blood clots, the need for revision surgery, cardiopulmonary complications, among others, and they were willing to proceed.  PROCEDURE IN DETAIL: The patient was brought into the operating room and, after adequate spinal anesthesia was achieved, a tourniquet was placed on the patient's upper thigh. The patient's knee and leg were cleaned and prepped with alcohol and DuraPrep and draped  in the usual sterile fashion. A "timeout" was performed as per usual protocol. The lower extremity was exsanguinated using an Esmarch, and the tourniquet was inflated to 300 mmHg. An anterior longitudinal incision was made followed by a standard medial parapatellar approach. The deep fibers of the medial collateral ligament were elevated in a subperiosteal fashion off of the medial flare of the tibia so as to maintain a continuous soft tissue sleeve.  Swabs were obtained of the synovial fluid and submitted for stat gram stain and cultures.  The patella was subluxed laterally.  Inspection of the demonstrated dense fibrotic tissue completely filling both the medial and lateral gutters as well as the suprapatellar pouch.  The fibrotic material was systematically excised first along the medial gutter, then within the suprapatellar pouch, and finally down the lateral gutter.  This recreated both the medial and lateral gutters with improved flexibility noted.  Next, the polyethylene insert was removed.  It was noted to be An Attune size 5 6 mm polyethylene insert.  Additional dense fibrotic tissue was encountered in the posterior recess and the material was sharply excised using electrocautery as well as rongeurs.  The periphery of the tibial component was carefully inspected using curettes and rongeurs to debride some overhanging bone.  The bone cement interface was intact without any appreciable disruption.  Next, the curette and rongeurs were used to debride along the periphery of the femoral component.  Again, no evidence of disruption of the bone cement interface.  No evidence of loosening was appreciated.  Finally, excision of soft tissue around the patellar implant was performed using electrocautery.  The implant interface was intact without any evidence of loosening.  It was thus elected to proceed with trialing of a new polyethylene.  A size  five 6 mm rotating platform polyethylene trial was inserted and the knee  was placed range of motion.  Full extension was noted and greater than 130 degrees of flexion was appreciated.  Good patellar tracking was appreciated.  The trial polyethylene was removed.  Tourniquet was deflated after total tourniquet time of 72 minutes.. Hemostasis was achieved using electrocautery. The knee was irrigated with copious amounts of normal saline using pulsatile lavage followed by 500 ml of Surgiphor and then suctioned dry. 20 mL of 1.3% Exparel and 60 mL of 0.25% Marcaine in 40 mL of normal saline was injected along the posterior capsule, medial and lateral gutters, and along the arthrotomy site. A size 5 6 mm stabilized rotating platform polyethylene insert was inserted and the knee was placed through a range of motion with excellent mediolateral soft tissue balancing appreciated and excellent patellar tracking noted. 2 medium drains were placed in the wound bed and brought out through separate stab incisions. The medial parapatellar portion of the incision was reapproximated using interrupted sutures of #1 Vicryl. Subcutaneous tissue was approximated in layers using first #0 Vicryl followed #2-0 Vicryl. The skin was approximated with skin staples. A sterile dressing was applied.  The patient tolerated the procedure well and was transported to the recovery room in stable condition.    [name] P. Angie Fava., M.D.

## 2021-05-05 ENCOUNTER — Encounter: Payer: Self-pay | Admitting: Orthopedic Surgery

## 2021-05-05 NOTE — Addendum Note (Signed)
Addendum  created 05/05/21 0803 by Karleen Hampshire, MD   Clinical Note Signed

## 2021-05-05 NOTE — Progress Notes (Signed)
Patient refused to get up and go to the bedside commode.  She was asked at 1930, she tried and said she couldn't feel her hip and stated she would try later.  Patient refused to get up for the NA she stated she would get up during the day.

## 2021-05-05 NOTE — Plan of Care (Signed)

## 2021-05-05 NOTE — Progress Notes (Signed)
Dr Ernest Pine text paged and asked how long to leave cpm on. He states in a text page that it should "be at least 4 hours if not more". cpm on since 1600

## 2021-05-05 NOTE — Evaluation (Signed)
Occupational Therapy Evaluation Patient Details Name: MARGUITA VENNING MRN: [id number] DOB: 1965/12/29 Today's Date: 05/05/2021    History of Present Illness LEONDA CRISTO is a 55 y.o. year old female who presents for total knee revision arthroplasty on 05/04/21. She had left total knee arthroplasty 4 years ago, followed by significant restriction with her knee range of motion despite manipulation under anesthesia and arthroscopic debridement, as well as increased pain since the initial TKA.   Clinical Impression   Pt seen for OT evaluation this date, POD#1 from above surgery. Pt was independent in all ADLs prior to surgery, has not been using DME for mobility; however endorses having frequent falls, including 3 in the week prior to surgery. Pt is eager to return to PLOF with less pain and improved safety and independence. Pt currently reports 4/10 pain, requires SUPV for transfers and ambulation with RW, ModA for LB dressing while in seated position due to pain and limited AROM of L knee. Pt and spouse instructed in polar care mgt, falls prevention strategies, home/routines modifications, DME/AE for LB bathing and dressing tasks, and compression stocking mgt. Both verbalize good understanding and provide teach-back. Do not currently anticipate any OT needs following this hospitalization.      Follow Up Recommendations  No OT follow up    Equipment Recommendations  None recommended by OT    Recommendations for Other Services       Precautions / Restrictions Precautions Precautions: Fall Restrictions Weight Bearing Restrictions: Yes LLE Weight Bearing: Weight bearing as tolerated      Mobility Bed Mobility Overal bed mobility: Needs Assistance             General bed mobility comments: pt received in recliner    Transfers Overall transfer level: Needs assistance Equipment used: Rolling walker (2 wheeled) Transfers: Sit to/from [employer] Transfers Sit to Stand:  Supervision Stand pivot transfers: Supervision       General transfer comment: RW for sit<>stand and transfers; VCs required for correct hand placement    Balance Overall balance assessment: Needs assistance Sitting-balance support: No upper extremity supported Sitting balance-Leahy Scale: Good     Standing balance support: Bilateral upper extremity supported Standing balance-Leahy Scale: Good                             ADL either performed or assessed with clinical judgement   ADL Overall ADL's : Needs assistance/impaired                     Lower Body Dressing: Moderate assistance Lower Body Dressing Details (indicate cue type and reason): donning/doffing socks Toilet Transfer: Supervision/safety;Comfort height toilet;RW   Toileting- Clothing Manipulation and Hygiene: Modified independent;Sit to/from stand               Vision Patient Visual Report: No change from baseline       Perception     Praxis      Pertinent Vitals/Pain Pain Assessment: 0-10 Pain Score: 4  Pain Location: L knee Pain Intervention(s): Limited activity within patient's tolerance;Monitored during session;Premedicated before session;Ice applied;Repositioned     Hand Dominance     Extremity/Trunk Assessment Upper Extremity Assessment Upper Extremity Assessment: Overall WFL for tasks assessed   Lower Extremity Assessment Lower Extremity Assessment: LLE deficits/detail LLE Deficits / Details: s/p TKA LLE: Unable to fully assess due to pain LLE Sensation: WNL       Communication Communication Communication: No  difficulties   Cognition Arousal/Alertness: Awake/alert Behavior During Therapy: WFL for tasks assessed/performed Overall Cognitive Status: Within Functional Limits for tasks assessed                                     General Comments       Exercises Other Exercises Other Exercises: educ pt/spouse on polar care mgmt, TED hose mgmt,  falls prevention, DME, AE for LB dressing/bathing, routines modification   Shoulder Instructions      Home Living Family/patient expects to be discharged to:: Private residence Living Arrangements: Spouse/significant other Available Help at Discharge: Family;Available 24 hours/day Type of Home: House Home Access: Stairs to enter [employer] of Steps: 5-6   Home Layout: Two level;Able to live on main level with bedroom/bathroom     Bathroom Shower/Tub: Chief Strategy Officer: Standard     Home Equipment: Environmental consultant - 2 wheels;Shower seat;Bedside commode          Prior Functioning/Environment Level of Independence: Independent                 OT Problem List: Decreased strength;Decreased range of motion;Decreased activity tolerance;Impaired balance (sitting and/or standing);Decreased knowledge of use of DME or AE;Pain      OT Treatment/Interventions:      OT Goals(Current goals can be found in the care plan section) Acute Rehab OT Goals Patient Stated Goal: to be pain-free OT Goal Formulation: With patient Time For Goal Achievement: 05/19/21 Potential to Achieve Goals: Good  OT Frequency:     Barriers to D/C:            Co-evaluation              AM-PAC OT "6 Clicks" Daily Activity     Outcome Measure Help from another person eating meals?: None Help from another person taking care of personal grooming?: None Help from another person toileting, which includes using toliet, bedpan, or urinal?: A Little Help from another person bathing (including washing, rinsing, drying)?: A Little Help from another person to put on and taking off regular upper body clothing?: None Help from another person to put on and taking off regular lower body clothing?: A Lot 6 Click Score: 20   End of Session Equipment Utilized During Treatment: Rolling walker  Activity Tolerance: Patient tolerated treatment well Patient left: in chair;with family/visitor  present;with call bell/phone within reach  OT Visit Diagnosis: Other abnormalities of gait and mobility (R26.89);Muscle weakness (generalized) (M62.81)                Time: 8563-1497 OT Time Calculation (min): 48 min Charges:  OT General Charges $OT Visit: 1 Visit OT Evaluation $OT Eval Low Complexity: 1 Low OT Treatments $Self Care/Home Management : 38-52 mins  Latina Craver, PhD, MS, OTR/L 05/05/21, 11:28 AM

## 2021-05-05 NOTE — Progress Notes (Signed)
Physical Therapy Treatment Patient Details Name: Brooke Martin MRN: [id number] DOB: 02/28/1966 Today's Date: 05/05/2021    History of Present Illness Brooke Martin is a 55 y.o. year old female who presents for total knee revision arthroplasty on 05/04/21. She had left total knee arthroplasty 4 years ago, followed by significant restriction with her knee range of motion despite manipulation under anesthesia and arthroscopic debridement, as well as increased pain since the initial TKA.    PT Comments    Pt continues to be motivated and show great effort with PT.  She does have continued issues with full quad engagement and is not able to do AROM SLRs or full range SAQ w/o at least some assist.  ROM remains relatively stiff, though she did tolerate ROM activities better than expected despite inherent pain  (AROM to <60 pre ROM exercises AROM continues to be limited but with PROM she did attain mid 80s by the end of session).  Pt able to circumambulate the nurses station and negotiate up/down steps w/o physical assist.  Follow Up Recommendations  Home health PT;Supervision - Intermittent     Equipment Recommendations  Rolling walker with 5" wheels;3in1 (PT)    Recommendations for Other Services       Precautions / Restrictions Precautions Precautions: Fall Restrictions LLE Weight Bearing: Weight bearing as tolerated    Mobility  Bed Mobility Overal bed mobility: Modified Independent Bed Mobility: Supine to Sit;Sit to Supine     Supine to sit: Min guard Sit to supine: Min guard   General bed mobility comments: needing to use R LE to assist L, but no physical assist    Transfers Overall transfer level: Needs assistance Equipment used: Rolling walker (2 wheeled) Transfers: Sit to/from Stand Sit to Stand: Supervision         General transfer comment: continues to need cuing for set up, but no assist needed to rise  Ambulation/Gait Ambulation/Gait assistance: Min guard Gait  Distance (Feet): 250 Feet Assistive device: Rolling walker (2 wheeled)       General Gait Details: Pt was able to quickly assume consistent cadence without excessive UE reliance   Stairs Stairs: Yes Stairs assistance: Supervision Stair Management: One rail Left Number of Stairs: 4 General stair comments: Pt able to ascend/descend steps confidently and w/o assist.   Wheelchair Mobility    Modified Rankin (Stroke Patients Only)       Balance Overall balance assessment: Needs assistance Sitting-balance support: No upper extremity supported Sitting balance-Leahy Scale: Good     Standing balance support: Bilateral upper extremity supported Standing balance-Leahy Scale: Good                              Cognition Arousal/Alertness: Awake/alert Behavior During Therapy: WFL for tasks assessed/performed Overall Cognitive Status: Within Functional Limits for tasks assessed                                        Exercises Total Joint Exercises Quad Sets: Strengthening;15 reps Short Arc Quad: AROM;AAROM;10 reps (unable to attain TKE actively) Heel Slides: AAROM;10 reps Hip ABduction/ADduction: 10 reps;Strengthening Straight Leg Raises: AAROM;10 reps (pt struggles to initiate against gravity movement) Knee Flexion: PROM;20 reps (Pt continues to have tightness/pain with ROM tasks.  pulsed and sustained progressive overpressure per tolerance.) Goniometric ROM: 0-82    General Comments  Pertinent Vitals/Pain Pain Assessment: 0-10 Pain Score: 4     Home Living                      Prior Function            PT Goals (current goals can now be found in the care plan section) Progress towards PT goals: Progressing toward goals    Frequency    BID      PT Plan Current plan remains appropriate    Co-evaluation              AM-PAC PT "6 Clicks" Mobility   Outcome Measure  Help needed turning from your back to  your side while in a flat bed without using bedrails?: None Help needed moving from lying on your back to sitting on the side of a flat bed without using bedrails?: None Help needed moving to and from a bed to a chair (including a wheelchair)?: A Little Help needed standing up from a chair using your arms (e.g., wheelchair or bedside chair)?: A Little Help needed to walk in hospital room?: A Little Help needed climbing 3-5 steps with a railing? : A Little 6 Click Score: 20    End of Session Equipment Utilized During Treatment: Gait belt Activity Tolerance: Patient tolerated treatment well;Patient limited by pain Patient left: with call bell/phone within reach;with chair alarm set Nurse Communication: Mobility status PT Visit Diagnosis: Muscle weakness (generalized) (M62.81);Difficulty in walking, not elsewhere classified (R26.2);Pain Pain - Right/Left: Left Pain - part of body: Knee     Time: 9450-3888 PT Time Calculation (min) (ACUTE ONLY): 54 min  Charges:  $Gait Training: 8-22 mins $Therapeutic Exercise: 23-37 mins $Therapeutic Activity: 8-22 mins                     Malachi Pro, DPT 05/05/2021, 6:18 PM

## 2021-05-05 NOTE — TOC Progression Note (Signed)
Transition of Care [facility]) - Progression Note    Patient Details  Name: Brooke Martin MRN: [id number] Date of Birth: 16-Aug-1966  Transition of Care [facility]) CM/SW [place], RN Phone Number: 05/05/2021, 10:29 AM  Clinical Narrative:    Met with the patient in the room, she lives at home with her husband that provides transportation, she has a standard walker at home and will need a RW and a 3 in 1, that will be brought to the room prior to DC, She has outpatient PT set up [place],  She can afford her meds, no additional needs        Expected Discharge Plan and Services                                                 Social Determinants of Health (SDOH) Interventions    Readmission Risk Interventions No flowsheet data found.

## 2021-05-05 NOTE — Progress Notes (Signed)
ORTHOPAEDICS: The patient rested well last night.  Pain has been under good control.  She denies any nausea or vomiting.  Bone foam is in place.  Left lower extremity dressing is dry and intact.  Polar Care and Hemovac are in place and functioning.  The patient can perform a straight leg raise with assist.  Homans test is negative.  Impression: Left knee arthrotomy, lysis of adhesions, and polyethylene exchange  Plan: Begin physical therapy and Occupational Therapy as per total knee arthroplasty rehab protocol.  The patient may be weightbearing as tolerated.  Aggressive range of motion.  The patient lives in [place].  She has apparently already made tentative arrangements for outpatient physical therapy.  Kirstin Kugler P. Angie Fava M.D.

## 2021-05-05 NOTE — Evaluation (Signed)
Physical Therapy Evaluation Patient Details Name: Brooke Martin MRN: [id number] DOB: 11-09-1966 Today's Date: 05/05/2021   History of Present Illness  Brooke Martin is a 55 y.o. year old female who presents for total knee revision arthroplasty on 05/04/21. She had left total knee arthroplasty 4 years ago, followed by significant restriction with her knee range of motion despite manipulation under anesthesia and arthroscopic debridement, as well as increased pain since the initial TKA.  Clinical Impression  Pt eager to work with PT, given the stiffness in her knee after prior TKA and manipulation she is keen to push her self regarding ROM.  Before ROM exercises, etc she was unable to get L knee flexion >60, with extensive progressive ROM activity PT was able to PROM get her to mid 80s with considerable pain.  Pt was able to ambulate ~100 ft relatively safely, also did well with mobility and transfers.  Clearly ROM will need to be her biggest focus, she does have a recumbent bike at home that will likely be a critical aspect of her recovery regarding ROM.    Follow Up Recommendations Home health PT;Supervision - Intermittent    Equipment Recommendations  3in1 (PT)    Recommendations for Other Services       Precautions / Restrictions Precautions Precautions: Fall Restrictions Weight Bearing Restrictions: No LLE Weight Bearing: Weight bearing as tolerated      Mobility  Bed Mobility Overal bed mobility: Needs Assistance Bed Mobility: Supine to Sit     Supine to sit: Min guard     General bed mobility comments: Pt able to transition to EOB w/o assist and minimal hesitation    Transfers Overall transfer level: Needs assistance Equipment used: Rolling walker (2 wheeled) Transfers: Sit to/from Stand Sit to Stand: Supervision Stand pivot transfers: Supervision       General transfer comment: cues for UE use/set up, able to rise w/o physical  assist  Ambulation/Gait Ambulation/Gait assistance: Min guard Gait Distance (Feet): 100 Feet Assistive device: Rolling walker (2 wheeled)       General Gait Details: Pt with expected hesitancy during initial ambulation; showed increased speed, confidence, consistancy with increasing distance.  Stairs            Wheelchair Mobility    Modified Rankin (Stroke Patients Only)       Balance Overall balance assessment: Needs assistance Sitting-balance support: No upper extremity supported Sitting balance-Leahy Scale: Good     Standing balance support: Bilateral upper extremity supported Standing balance-Leahy Scale: Good                               Pertinent Vitals/Pain Pain Assessment: 0-10 Pain Score: 4  (increases with ROM/activity) Pain Location: L knee Pain Intervention(s): Limited activity within patient's tolerance;Monitored during session;Premedicated before session;Ice applied;Repositioned    Home Living Family/patient expects to be discharged to:: Private residence Living Arrangements: Spouse/significant other Available Help at Discharge: Family;Available 24 hours/day Type of Home: House Home Access: Stairs to enter Entrance Stairs-Rails: Doctor, general practice of Steps: 5-6 Home Layout: Two level;Able to live on main level with bedroom/bathroom (exercise equipment in the home) Home Equipment: Walker - 2 wheels;Shower seat;Bedside commode      Prior Function Level of Independence: Independent         Comments: Pt is PE teacher, 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     Hand Dominance        Extremity/Trunk Assessment  Upper Extremity Assessment Upper Extremity Assessment: Overall WFL for tasks assessed    Lower Extremity Assessment Lower Extremity Assessment:  (expected post-op weakness) LLE Deficits / Details: s/p TKA LLE: Unable to fully assess due to pain LLE Sensation: WNL       Communication   Communication: No  difficulties  Cognition Arousal/Alertness: Awake/alert Behavior During Therapy: WFL for tasks assessed/performed Overall Cognitive Status: Within Functional Limits for tasks assessed                                        General Comments      Exercises Total Joint Exercises Ankle Circles/Pumps: AROM;10 reps Quad Sets: Strengthening;10 reps Short Arc Quad: AAROM;10 reps (struggled to initiate against gravity movement w/o AAROM) Heel Slides: AAROM;10 reps Hip ABduction/ADduction: 10 reps Straight Leg Raises: AAROM;10 reps (no AROM against gravity) Knee Flexion: PROM;20 reps (various interations of knee PROM in supine and in sitting.  Gentle pulsed and sustained end range overpressure per tolerance.) Goniometric ROM: 0-84, AROM before ROM exercises <60, pain but good attitude during extensive ROM tasks Other Exercises Other Exercises: educ pt/spouse on polar care mgmt, TED hose mgmt, falls prevention, DME, AE for LB dressing/bathing, routines modification   Assessment/Plan    PT Assessment Patient needs continued PT services  PT Problem List Decreased strength;Decreased range of motion;Decreased activity tolerance;Decreased balance;Decreased mobility;Decreased knowledge of use of DME;Decreased safety awareness;Pain       PT Treatment Interventions DME instruction;Gait training;Stair training;Therapeutic activities;Functional mobility training;Therapeutic exercise;Balance training;Patient/family education;Neuromuscular re-education    PT Goals (Current goals can be found in the Care Plan section)  Acute Rehab PT Goals Patient Stated Goal: make sure her ROM improves and is maintained PT Goal Formulation: With patient Time For Goal Achievement: 05/19/21 Potential to Achieve Goals: Good    Frequency Min 2X/week   Barriers to discharge        Co-evaluation               AM-PAC PT "6 Clicks" Mobility  Outcome Measure Help needed turning from your back to  your side while in a flat bed without using bedrails?: None Help needed moving from lying on your back to sitting on the side of a flat bed without using bedrails?: None Help needed moving to and from a bed to a chair (including a wheelchair)?: A Little Help needed standing up from a chair using your arms (e.g., wheelchair or bedside chair)?: A Little Help needed to walk in hospital room?: A Little Help needed climbing 3-5 steps with a railing? : A Lot 6 Click Score: 19    End of Session Equipment Utilized During Treatment: Gait belt Activity Tolerance: Patient tolerated treatment well;Patient limited by pain Patient left: with call bell/phone within reach;with chair alarm set Nurse Communication: Mobility status PT Visit Diagnosis: Muscle weakness (generalized) (M62.81);Difficulty in walking, not elsewhere classified (R26.2);Pain Pain - Right/Left: Left Pain - part of body: Knee    Time: 7169-6789 PT Time Calculation (min) (ACUTE ONLY): 54 min   Charges:   PT Evaluation $PT Eval Low Complexity: 1 Low PT Treatments $Gait Training: 8-22 mins $Therapeutic Exercise: 23-37 mins        Malachi Pro, DPT 05/05/2021, 1:23 PM

## 2021-05-05 NOTE — Anesthesia Postprocedure Evaluation (Addendum)
Anesthesia Post Note  Patient: Brooke Martin  Procedure(s) Performed: TOTAL KNEE REVISION (Left Knee)  Patient location during evaluation: Other Anesthesia Type: Spinal Level of consciousness: awake and alert Pain management: pain level controlled Respiratory status: spontaneous breathing and nonlabored ventilation Cardiovascular status: stable Postop Assessment: no apparent nausea or vomiting and spinal receding Anesthetic complications: no   No complications documented.   Last Vitals:  Vitals:   05/04/21 1946 05/05/21 0531  BP: 128/74 119/77  Pulse: 70 68  Resp: 17 17  Temp: 36.4 C 36.4 C  SpO2: 97% 99%    Last Pain:  Vitals:   05/04/21 2215  TempSrc:   PainSc: Asleep                 Karleen Hampshire

## 2021-05-06 LAB — SURGICAL PATHOLOGY

## 2021-05-06 MED ORDER — OXYCODONE HCL 5 MG PO TABS: 5.0000 mg | ORAL_TABLET | ORAL | 0 refills | Status: AC | PRN

## 2021-05-06 MED ORDER — ENOXAPARIN SODIUM 40 MG/0.4ML IJ SOSY: 40.0000 mg | PREFILLED_SYRINGE | INTRAMUSCULAR | 0 refills | Status: AC

## 2021-05-06 MED ORDER — CELECOXIB 200 MG PO CAPS: 200.0000 mg | ORAL_CAPSULE | Freq: Two times a day (BID) | ORAL | 0 refills | Status: AC

## 2021-05-06 NOTE — TOC Progression Note (Addendum)
Transition of Care [facility]) - Progression Note    Patient Details  Name: Brooke Martin MRN: [id number] Date of Birth: 1966-02-05  [facility]) CM/SW Contact  Barrie Dunker, RN Phone Number: 05/06/2021, 8:47 AM  Clinical Narrative:   Phyllis Ginger with [employer] the CPM is no longer available, [employer] stated that they no longer carry the CPM machine either. Called [employer] patient, they no longer carry it either. I Called [employer] in [place] left a voice mail requesting a call, I contacted Dr Ernest Pine via phone call to notify that the DME companies do not have them for rent any longer, He requested that I contact manufacturers to rent, I contacted [employer] and [employer], both are over $500 for 2 weeks rental, the patient declined to rent the machine and stated that she has a recumbent bike that she will use, PT has walked her through how to use it.        Expected Discharge Plan and Services           Expected Discharge Date: 05/06/21                                     Social Determinants of Health (SDOH) Interventions    Readmission Risk Interventions No flowsheet data found.

## 2021-05-06 NOTE — Progress Notes (Signed)
  Subjective: 2 Days Post-Op Procedure(s) (LRB): TOTAL KNEE REVISION (Left) Patient reports pain as well-controlled.   Patient is well, and has had no acute complaints or problems Plan is to go Home after hospital stay. Negative for chest pain and shortness of breath Fever: no Gastrointestinal: negative for nausea and vomiting.   Patient has had a bowel movement.  Objective: Vital signs in last 24 hours: Temp:  [97.6 F (36.4 C)-99.3 F (37.4 C)] 99.3 F (37.4 C) (05/11 0532) Pulse Rate:  [67-89] 89 (05/11 0532) Resp:  [16-18] 18 (05/11 0532) BP: (110-127)/(65-84) 110/80 (05/11 0532) SpO2:  [98 %-100 %] 98 % (05/11 0532)  Intake/Output from previous day:  Intake/Output Summary (Last 24 hours) at 05/06/2021 0708 Last data filed at 05/06/2021 0532 Gross per 24 hour  Intake 540 ml  Output 180 ml  Net 360 ml    Intake/Output this shift: No intake/output data recorded.  Labs: No results for input(s): HGB in the last 72 hours. No results for input(s): WBC, RBC, HCT, PLT in the last 72 hours. No results for input(s): NA, K, CL, CO2, BUN, CREATININE, GLUCOSE, CALCIUM in the last 72 hours. No results for input(s): LABPT, INR in the last 72 hours.   EXAM General - Patient is Alert, Appropriate and Oriented Extremity - Neurovascular intact Dorsiflexion/Plantar flexion intact Compartment soft Dressing/Incision -Polar Care in place and working. , Hemovac in place. , Following removal of post-op dressing, mild sanguinous drainage noted. Motor Function - intact, moving foot and toes well on exam. Continues to have difficulty with SLR. Cardiovascular- Regular rate and rhythm, no murmurs/rubs/gallops Respiratory- Lungs clear to auscultation bilaterally Gastrointestinal- soft, nontender and active bowel sounds   Assessment/Plan: 2 Days Post-Op Procedure(s) (LRB): TOTAL KNEE REVISION (Left) Active Problems:   History of revision of total knee arthroplasty  Estimated body mass  index is 30.23 kg/m as calculated from the following:   Height as of this encounter: 5\' 5"  (1.651 m).   Weight as of this encounter: 82.4 kg. Advance diet Up with therapy Discharge home with outpatient PT   Patient will be d/c'd with CPM machine.   Post-op dressing removed. , Hemovac removed. and Mini compression dressing applied.   DVT Prophylaxis - Lovenox, Ted hose and foot pumps Weight-Bearing as tolerated to left leg  , PA-C [facility] 05/06/2021, 7:08 AM

## 2021-05-06 NOTE — Discharge Summary (Signed)
Physician Discharge Summary  Patient ID: Brooke Martin MRN: [id number] DOB/AGE: 54-Feb-1967 54 y.o.  Admit date: 05/04/2021 Discharge date: 05/06/2021  Admission Diagnoses:  History of revision of total knee arthroplasty [Z96.659]  Surgeries:Procedure(s): Extensive synovectomy and lysis of adhesions, replacement of the polyethylene insert of the left knee  SURGEON:  Jena Gauss. M.D.  ASSISTANT: Baldwin Jamaica, PA-C (present and scrubbed throughout the case, critical for assistance with exposure, retraction, instrumentation, and closure)  ANESTHESIA: spinal  ESTIMATED BLOOD LOSS: 25 mL  FLUIDS REPLACED: 800 mL of crystalloid  TOURNIQUET TIME: 72 minutes  DRAINS: 2 medium Hemovac drains  IMPLANTS UTILIZED: De[employer]uy Attune size 5 6 mm stabilized rotating platform polyethylene insert.  Discharge Diagnoses: Patient Active Problem List   Diagnosis Date Noted  . History of revision of total knee arthroplasty 05/04/2021  . Body mass index (BMI) 31.0-31.9, adult 05/03/2021  . 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 Arthrofibrosis of knee joint, left 10/07/2020  . Sprain, metatarsophalangeal joint 02/22/2019  . Numbness 11/22/2018  . Status post total left knee replacement 05/07/2018  . Acquired hallux rigidus 02/06/2018  . H/O urticaria 08/23/2016    Past Medical History:  Diagnosis Date  . Adverse effect of anesthesia, initial encounter    had bad headaches with many procedures, and sob  . Arthritis   . Complication of anesthesia    hard a hard time numbing the toe, and had a hard time going to sleep with sedation  . Diabetes mellitus without complication (HCC)   . Dyspnea    after surgery  . Family history of adverse reaction to anesthesia    ponv  . Hypercholesterolemia   . Hypertension   . Sleep apnea    uses cpap     Transfusion:    Consultants (if any):   Discharged Condition: Improved  Hospital Course: Brooke Martin is an 55 y.o. female who was admitted 05/04/2021 with a diagnosis of arthrofibrosis, possible prosthetic loosening s/p left total knee arthroplasty and went to the operating room on 05/04/2021 and underwent extensive synovectomy and lysis of adhesions, replacement of polyethylene insert of the left knee. The patient received perioperative antibiotics for prophylaxis (see below). The patient tolerated the procedure well and was transported to PACU in stable condition. After meeting PACU criteria, the patient was subsequently transferred to the Orthopaedics/Rehabilitation unit.   The patient received DVT prophylaxis in the form of early mobilization, Lovenox, Foot Pumps and TED hose. A sacral pad had been placed and heels were elevated off of the bed with rolled towels in order to protect skin integrity. Foley catheter was discontinued on postoperative day #0. Wound drains were discontinued on postoperative day #2. The surgical incision was healing well without signs of infection.  Physical therapy was initiated postoperatively for transfers, gait training, and strengthening. Occupational therapy was initiated for activities of daily living and evaluation for assisted devices. Rehabilitation goals were reviewed in detail with the patient. The patient made steady progress with physical therapy and physical therapy recommended discharge to Home.   The patient achieved the preliminary goals of this hospitalization and was felt to be medically and orthopaedically appropriate for discharge.  She was given perioperative antibiotics:  Anti-infectives (From admission, onward)   Start     Dose/Rate Route Frequency Ordered Stop   05/04/21 1800  ceFAZolin (ANCEF) IVPB 2g/100 mL premix        2 g 200 mL/hr over 30 Minutes Intravenous Every 6  hours 05/04/21 1710 05/05/21 0133   05/04/21 1017  ceFAZolin (ANCEF) 2-4 GM/100ML-% IVPB       Note to Pharmacy: Agnes Lawrence  : cabinet override      05/04/21 1017  05/04/21 1205   05/04/21 0600  ceFAZolin (ANCEF) IVPB 2g/100 mL premix        2 g 200 mL/hr over 30 Minutes Intravenous On call to O.R. 05/04/21 8295 05/04/21 1219    .  Recent vital signs:  Vitals:   05/05/21 2024 05/06/21 0532  BP: 127/80 110/80  Pulse: 77 89  Resp: 18 18  Temp: 98.9 F (37.2 C) 99.3 F (37.4 C)  SpO2: 100% 98%    Recent laboratory studies:  No results for input(s): WBC, HGB, HCT, PLT, K, CL, CO2, BUN, CREATININE, GLUCOSE, CALCIUM, LABPT, INR in the last 72 hours.  Diagnostic Studies: DG Knee Left Port  Result Date: 05/04/2021 CLINICAL DATA:  Post total knee replacement. EXAM: PORTABLE LEFT KNEE - 1-2 VIEW COMPARISON:  None. FINDINGS: Left knee arthroplasty in expected alignment. No periprosthetic lucency or fracture. There has been patellar resurfacing. Recent postsurgical change includes air and edema in the soft tissues and joint space. Surgical drain in the suprapatellar region. Anterior skin staples. IMPRESSION: Left knee arthroplasty without immediate postoperative complication. Electronically Signed   By: Narda Rutherford M.D.   On: 05/04/2021 15:39    Discharge Medications:   Allergies as of 05/06/2021      Reactions   Lipitor [atorvastatin Calcium] Anaphylaxis      Medication List    STOP taking these medications   aspirin EC 81 MG tablet   HYDROcodone-acetaminophen 5-325 MG tablet Commonly known as: Norco     TAKE these medications   ALPRAZolam 1 MG tablet Commonly known as: XANAX Take 1 mg by mouth daily as needed (prior to dental procedures).   amoxicillin 500 MG tablet Commonly known as: AMOXIL Take 2,000 mg by mouth See admin instructions. Take 2000 mg by mouth before dental procedures   baclofen 10 MG tablet Commonly known as: LIORESAL Take 10 mg by mouth at bedtime.   carbamazepine 100 MG 12 hr capsule Commonly known as: CARBATROL Take 200 mg by mouth 2 (two) times daily.   celecoxib 200 MG capsule Commonly known as:  CELEBREX Take 1 capsule (200 mg total) by mouth 2 (two) times daily.   Co Q-10 100 MG Caps Take 100 mg by mouth daily.   enoxaparin 40 MG/0.4ML injection Commonly known as: LOVENOX Inject 0.4 mLs (40 mg total) into the skin daily for 14 days.   EPINEPHrine 0.3 mg/0.3 mL Soaj injection Commonly known as: EPI-PEN Inject 0.3 mg into the muscle once as needed (anaphylaxis).   ezetimibe 10 MG tablet Commonly known as: ZETIA Take 1 tablet by mouth daily.   fenofibrate 160 MG tablet Take 1 tablet by mouth daily.   hydrOXYzine 25 MG tablet Commonly known as: ATARAX/VISTARIL Take 25 mg by mouth at bedtime as needed (hives).   levocetirizine 5 MG tablet Commonly known as: XYZAL Take 5 mg by mouth daily as needed (hives).   losartan 100 MG tablet Commonly known as: COZAAR Take 100 mg by mouth daily.   meclizine 25 MG tablet Commonly known as: ANTIVERT Take 25 mg by mouth 3 (three) times daily as needed for dizziness.   metFORMIN 500 MG tablet Commonly known as: GLUCOPHAGE Take 500 mg by mouth daily with breakfast.   methocarbamol 750 MG tablet Commonly known as: ROBAXIN Take 1 tablet  by mouth every 6 (six) hours as needed for muscle spasms.   metoprolol succinate 50 MG 24 hr tablet Commonly known as: TOPROL-XL Take 50 mg by mouth daily. Take with or immediately following a meal.   Nexlizet 180-10 MG Tabs Generic drug: Bempedoic Acid-Ezetimibe Take 1 tablet by mouth daily.   OMEGA 3 500 PO Take 1 capsule by mouth daily.   omeprazole 20 MG capsule Commonly known as: PRILOSEC Take 20 mg by mouth daily as needed (heartburn).   oxybutynin 5 MG tablet Commonly known as: DITROPAN Take 5 mg by mouth daily as needed for bladder spasms.   oxyCODONE 5 MG immediate release tablet Commonly known as: Oxy IR/ROXICODONE Take 1 tablet (5 mg total) by mouth every 4 (four) hours as needed for moderate pain (pain score 4-6).   PX Complete Senior Multivits Tabs Take 1 tablet by  mouth daily.   rosuvastatin 40 MG tablet Commonly known as: CRESTOR Take 40 mg by mouth at bedtime.   traZODone 50 MG tablet Commonly known as: DESYREL Take 50 mg by mouth at bedtime.   Vitamin D (Ergocalciferol) 1.25 MG (50000 UNIT) Caps capsule Commonly known as: DRISDOL Take 50,000 Units by mouth 2 (two) times a week.            Durable Medical Equipment  (From admission, onward)         Start     Ordered   05/06/21 0612  For home use only DME Continuous passive motion machine  Once        05/06/21 0611   05/04/21 1711  DME Walker rolling  Once       Question:  Patient needs a walker to treat with the following condition  Answer:  Total knee replacement status   05/04/21 1710   05/04/21 1711  DME Bedside commode  Once       Question:  Patient needs a bedside commode to treat with the following condition  Answer:  Total knee replacement status   05/04/21 1710          Disposition: Home with outpatient PT     Follow-up Information    Myrtis Ser On 05/19/2021.   Specialty: Orthopedic Surgery Why: at 9:45am Contact information: [address] [facility] [place] [address] [phone]        Donato Heinz, MD On 06/16/2021.   Specialty: Orthopedic Surgery Why: at 1:30pm Contact information: [address] [facility] [address] [facility] [place] [address] [phone]                Lasandra Beech, PA-C 05/06/2021, 7:11 AM

## 2021-05-06 NOTE — TOC Progression Note (Signed)
Transition of Care [facility]) - Progression Note    Patient Details  Name: Brooke Martin MRN: [id number] Date of Birth: Apr 23, 1966  Transition of [facility]) CM/SW Contact  Barrie Dunker, RN Phone Number: 05/06/2021, 10:46 AM  Clinical Narrative:    Faxed order for CPM and face sheet to [employer] as requested by Dr Ernest Pine,         Expected Discharge Plan and Services           Expected Discharge Date: 05/06/21                                     Social Determinants of Health (SDOH) Interventions    Readmission Risk Interventions No flowsheet data found.

## 2021-05-06 NOTE — Plan of Care (Signed)
  Problem: Activity: Goal: Range of joint motion will improve Outcome: Progressing

## 2021-05-06 NOTE — Progress Notes (Signed)
Pt provided discharge instructions. Pts husband and pt educated on how to safely administer Lovenox injections at home. Pt and husband feel confident with medication and were able to provided confident teach back to RN. Tedhose applied to BLE and polar care sent home with pt. RN discussed CPM with Brooke Martin, CM and pt and husband were made aware they would be contacted about how to obtain CPM machine. Pt was instructed to contact Dr. Elenor Legato office if they did not hear from anyone regarding CPM. Pt discharged via wheelchair.   05/06/21 1059  Vitals  Temp 98 F (36.7 C)  Temp Source Oral  BP 108/63  MAP (mmHg) 79  BP Location Left Arm  BP Method Automatic  Patient Position (if appropriate) Sitting  Pulse Rate 77  Pulse Rate Source Monitor  Resp 18  MEWS COLOR  MEWS Score Color Green  Oxygen Therapy  SpO2 99 %  O2 Device Room Air

## 2021-05-10 LAB — AEROBIC/ANAEROBIC CULTURE W GRAM STAIN (SURGICAL/DEEP WOUND): Gram Stain: NONE SEEN

## 2021-12-09 ENCOUNTER — Other Ambulatory Visit: Payer: Self-pay | Admitting: Neurological Surgery

## 2021-12-09 DIAGNOSIS — M542 Cervicalgia: Secondary | ICD-10-CM

## 2021-12-15 ENCOUNTER — Other Ambulatory Visit: Payer: Self-pay

## 2021-12-15 ENCOUNTER — Ambulatory Visit
Admission: RE | Admit: 2021-12-15 | Discharge: 2021-12-15 | Disposition: A | Discharge status: Home / Self Care | Payer: BC Managed Care – PPO | Source: Ambulatory Visit | Attending: Neurological Surgery | Admitting: Neurological Surgery

## 2021-12-15 DIAGNOSIS — M542 Cervicalgia: Secondary | ICD-10-CM

## 2022-01-06 ENCOUNTER — Other Ambulatory Visit: Payer: BC Managed Care – PPO

## 2023-04-24 IMAGING — CT CT CERVICAL SPINE W/O CM
2 series · 10 of 14 positions shown, 12 images · non-contrast
Comparison: Preoperative cervical spine MRI 03/03/2020.
Postoperative cervical spine radiograph 10/01/2020.

CLINICAL DATA: 55-year-old female with prior fusion. Right side
neck pain with arm tingling and numbness.

EXAM:
CT CERVICAL SPINE WITHOUT CONTRAST
TECHNIQUE: Multidetector CT imaging of the cervical spine was performed without
intravenous contrast. Multiplanar CT image reconstructions were also
generated.

[Series 2: cspine soft · axial · 0.22mm/px · z∈[-283,-155]mm · 5 of 98 slices shown, 7 images]
[im 17/98  soft-tissue]
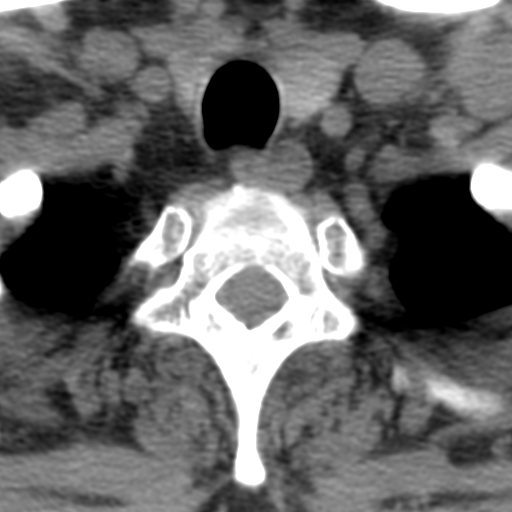
[im 17/98  bone]
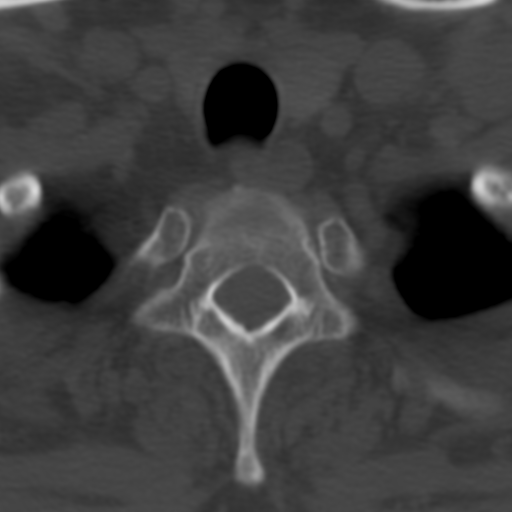
[im 33/98  bone]
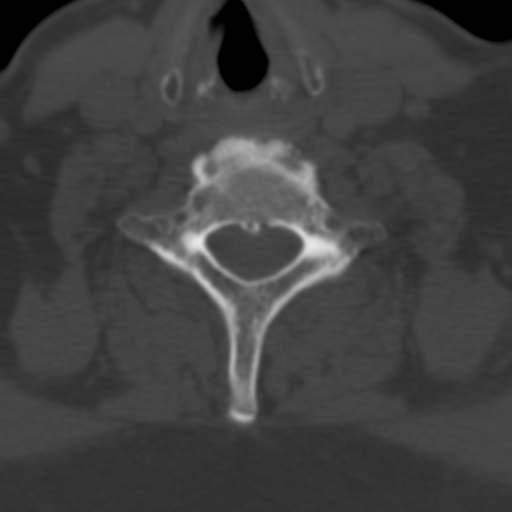
[im 49/98  bone]
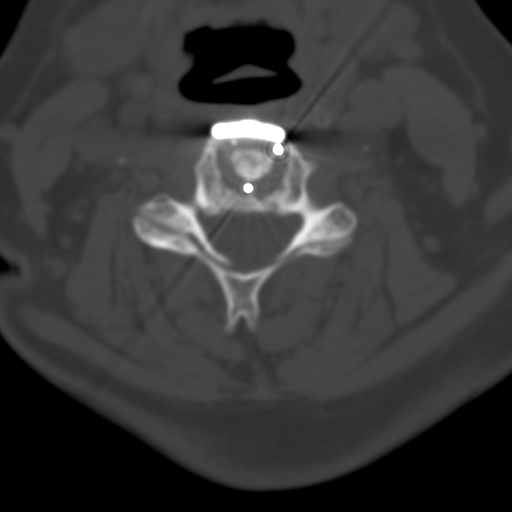
[im 65/98  bone]
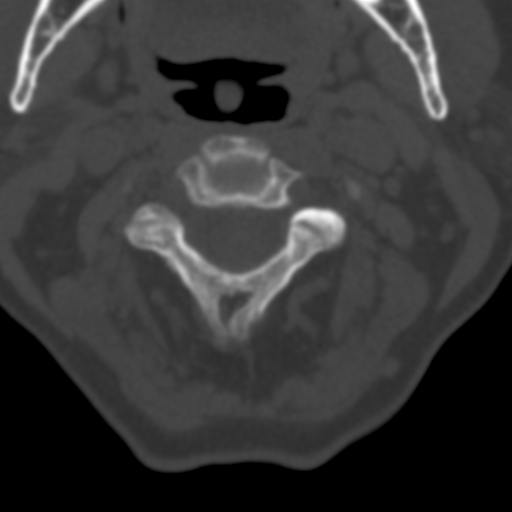
[im 81/98  soft-tissue]
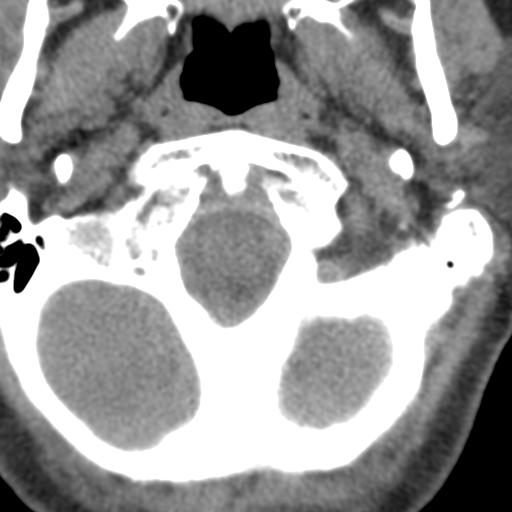
[im 81/98  bone]
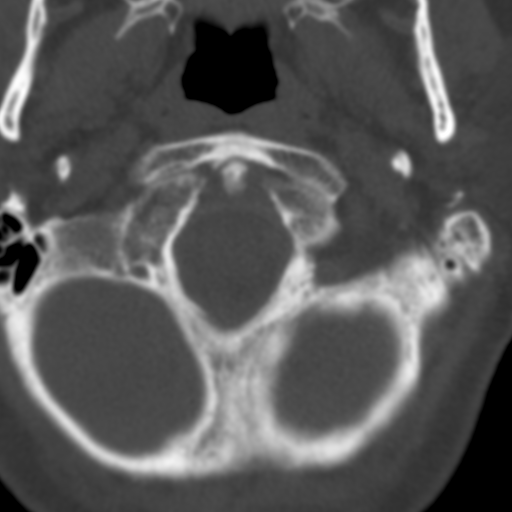

[Series 8: angled axial soft · axial · 0.21mm/px · z∈[-286,-159]mm · 5 of 97 slices shown]
[im 17/97  soft-tissue]
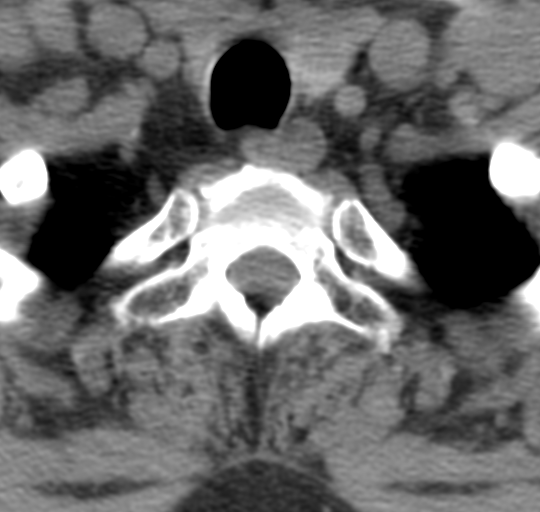
[im 33/97  soft-tissue]
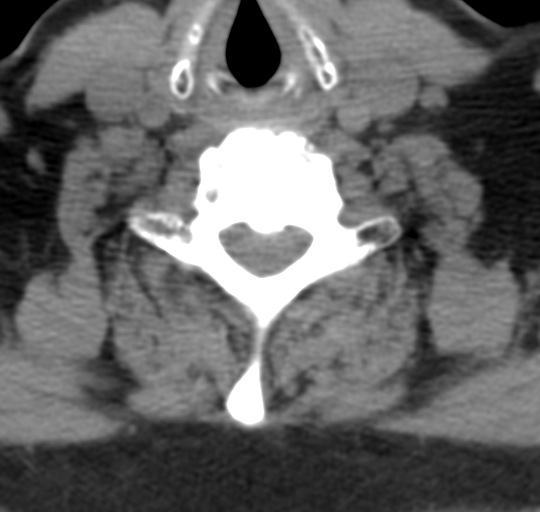
[im 49/97  soft-tissue]
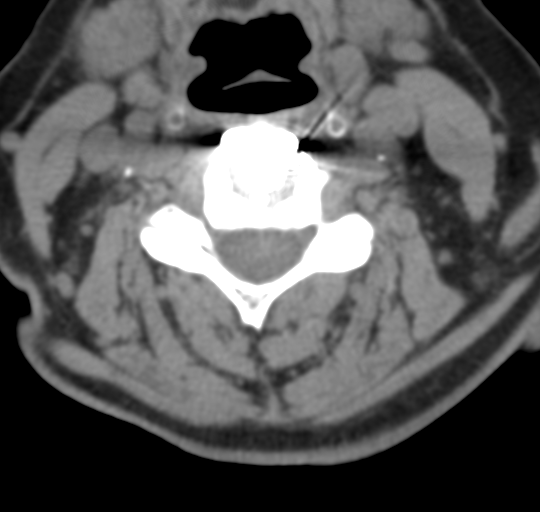
[im 65/97  soft-tissue]
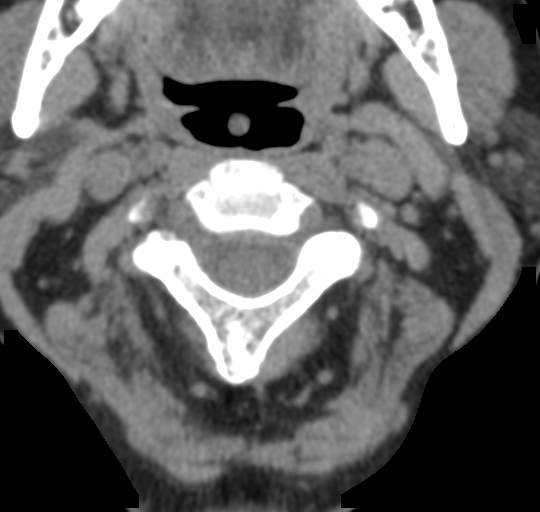
[im 81/97  soft-tissue]
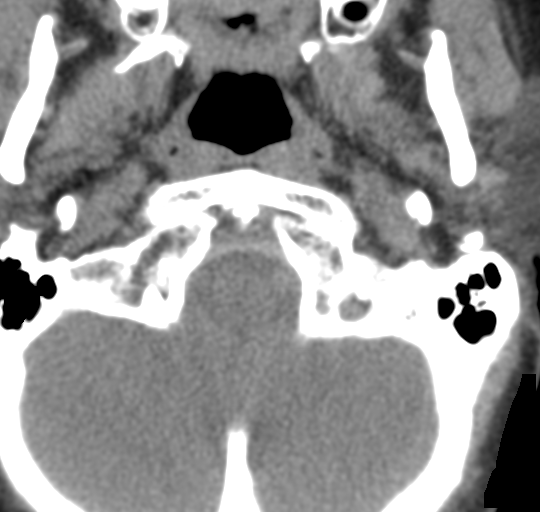

[10 of 14 positions shown; findings below may reference images not displayed]

FINDINGS: Alignment: Stable cervical lordosis from the postoperative
radiographs, improved compared to the preoperative MRI.
Cervicothoracic junction alignment is within normal limits.
Bilateral posterior element alignment is within normal limits.

Skull base and vertebrae: Normal background bone mineralization.
Visualized skull base is intact. No atlanto-occipital dissociation.
Postoperative details are below. No acute osseous abnormality
identified.

Soft tissues and spinal canal: Negative visible noncontrast neck
soft tissues; minimal calcified carotid atherosclerosis.

Disc levels:

C2-C3:  Negative.

C3-C4: Mild disc space loss, disc bulging and foraminal endplate
spurring. No significant stenosis.

C4-C5: ACDF. No evidence of hardware loosening, but no convincing
arthrodesis. Residual foraminal endplate spurring mostly on the
right.

C5-C6: ACDF. No evidence of hardware loosening. And there does
appear to be early interbody arthrodesis at this level (coronal
image 20). Residual foraminal endplate spurring.

C6-C7: Disc space loss with circumferential disc bulge and endplate
spurring. Mild spinal stenosis suspected, with up to moderate left
C7 foraminal stenosis. Mild if any right foraminal stenosis.

C7-T1: Disc space loss. Mild disc bulging and endplate spurring. No
spinal stenosis suspected. But with mild facet hypertrophy there is
probable mild bilateral C8 foraminal stenosis.

T1-T2: Negative.

Upper chest: Negative.

Other: Negative visible posterior fossa. Visible sphenoid sinuses,
tympanic cavities and mastoids are clear.
IMPRESSION: 1. ACDF at C4-C5 and C5-C6. No evidence of hardware loosening, but
early interbody arthrodesis only at C5-C6.
2. Adjacent segment disease at C6-C7 with suspected mild spinal
stenosis and up to moderate left C7 foraminal stenosis which may
have increased from the preoperative MRI last year.
